# Patient Record
Sex: Female | Born: 1970 | Race: White | Hispanic: No | Marital: Married | Smoking: Never smoker
Health system: Southern US, Community
[De-identification: ages and names within clinical notes are randomized; demographics above are authoritative.]

## PROBLEM LIST (undated history)

## (undated) DIAGNOSIS — I454 Nonspecific intraventricular block: Secondary | ICD-10-CM

## (undated) DIAGNOSIS — I10 Essential (primary) hypertension: Secondary | ICD-10-CM

## (undated) DIAGNOSIS — F32A Depression, unspecified: Secondary | ICD-10-CM

## (undated) DIAGNOSIS — F329 Major depressive disorder, single episode, unspecified: Secondary | ICD-10-CM

## (undated) DIAGNOSIS — Z9289 Personal history of other medical treatment: Secondary | ICD-10-CM

## (undated) DIAGNOSIS — E079 Disorder of thyroid, unspecified: Secondary | ICD-10-CM

## (undated) HISTORY — PX: CERVICAL FUSION: SHX112

## (undated) HISTORY — PX: TUBAL LIGATION: SHX77

## (undated) HISTORY — PX: OTHER SURGICAL HISTORY: SHX169

---

## 1998-08-10 ENCOUNTER — Encounter: Payer: Self-pay | Admitting: Specialist

## 1998-08-10 ENCOUNTER — Ambulatory Visit (HOSPITAL_COMMUNITY): Admission: RE | Admit: 1998-08-10 | Discharge: 1998-08-10 | Payer: Self-pay | Admitting: Specialist

## 2001-02-04 ENCOUNTER — Other Ambulatory Visit: Admission: RE | Admit: 2001-02-04 | Discharge: 2001-02-04 | Payer: Self-pay | Admitting: Obstetrics and Gynecology

## 2006-05-02 ENCOUNTER — Ambulatory Visit (HOSPITAL_COMMUNITY): Admission: RE | Admit: 2006-05-02 | Discharge: 2006-05-02 | Payer: Self-pay | Admitting: Obstetrics and Gynecology

## 2006-05-02 ENCOUNTER — Encounter (INDEPENDENT_AMBULATORY_CARE_PROVIDER_SITE_OTHER): Payer: Self-pay | Admitting: Specialist

## 2008-09-01 ENCOUNTER — Other Ambulatory Visit: Admission: RE | Admit: 2008-09-01 | Discharge: 2008-09-01 | Payer: Self-pay | Admitting: Obstetrics and Gynecology

## 2009-12-30 ENCOUNTER — Ambulatory Visit (HOSPITAL_COMMUNITY): Admission: RE | Admit: 2009-12-30 | Discharge: 2009-12-30 | Payer: Self-pay | Admitting: Obstetrics and Gynecology

## 2010-11-24 NOTE — H&P (Signed)
NAME:  Robin Huffman, Robin Huffman                 ACCOUNT NO.:  [id number]   MEDICAL RECORD NO.:  [id number]          PATIENT TYPE:  AMB   LOCATION:  DAY                           FACILITY:  [facility]   PHYSICIAN:  Tilda Burrow, M.D. DATE OF BIRTH:  1971-06-06   DATE OF ADMISSION:  DATE OF DISCHARGE:  LH                                HISTORY & PHYSICAL   ADMISSION DIAGNOSIS:  Bilateral Bartholin cysts, symptomatic left Bartholin  cyst, desire for elective permanent sterilization.   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  She is currently not considered to be acute infected. She is aware that the  potential for bleeding and slow healing is exists. The procedure has been  reviewed with her. We have chosen against involving the right side due to  the fact that it has been stable without inflammation for quite some time.   Additionally, she desires for permanent sterilization. She understands the  permanency of the requested procedure, has never desired children, and is  aware of the permanency of the requested procedure. One percent failure rate  is quoted to the patient. Falope ring application is reviewed.   PAST MEDICAL HISTORY:  Benign. Has hypothyroidism.   PAST SURGICAL HISTORY:  Negative.   ALLERGIES:  None.   GYNECOLOGICAL HISTORY:  Abnormal Pap 1994 with cryo cautery of the cervix  with subsequent normal Paps for several years.   PHYSICAL EXAMINATION:  Reveals a healthy, slim, Caucasian female. Height 5  foot 6 inches, weight 122. Blood pressure 100/60.  Pupils are equal, round, and reactive. Extraocular movements intact.  NECK:  Supple.  CHEST:  Clear to auscultation.  ABDOMEN:  Nontender.  EXTERNAL GENITALIA:  Nulliparous. Left  Bartholin's cyst 2 to 3 cm. Uterus  anteflexed - normal size, shape and contour. Adnexa nontender.   PLAN:  Laparoscopic tubal sterilization with Falope rings followed by  excision of Bartholin's cyst on left to be performed May 02, 2006.      Tilda Burrow, M.D.  Electronically Signed     JVF/MEDQ  D:  04/30/2006  T:  05/01/2006  Job:  [id number]   cc:   [facility]

## 2010-11-24 NOTE — Op Note (Signed)
NAMETASHEEMA, PERRONE                 ACCOUNT NO.:  [id number]   MEDICAL RECORD NO.:  [id number]          PATIENT TYPE:  AMB   LOCATION:  DAY                           FACILITY:  [facility]   PHYSICIAN:  Tilda Burrow, M.D. DATE OF BIRTH:  [birth year]   DATE OF PROCEDURE:  05/02/2006  DATE OF DISCHARGE:                                 OPERATIVE REPORT   PREOPERATIVE DIAGNOSIS:  Symptomatic left Bartholin's cyst, desire for  elective permanent sterilization.   POSTOPERATIVE DIAGNOSIS:  Symptomatic left Bartholin's cyst, desire for  elective permanent sterilization.   PROCEDURE:  Laparoscopic tubal sterilization, Falope rings, excision of left  Bartholin's cyst.   SURGEON:  Tilda Burrow, M.D.   ASSISTANT:  Victorino Dike - CSC   ANESTHESIA:  General with endotracheal intubation.   COMPLICATIONS:  None.   FINDINGS:  Normal tubes and ovaries bilaterally, smooth small Bartholin's  cyst, left side of introitus.   DETAILS OF THE PROCEDURE:  The patient was taken to the operating room,  prepped and draped for a combined abdominal and vaginal procedure.  An  infraumbilical vertical 1 cm skin incision was made as well as a transverse  suprapubic incision.  Pneumoperitoneum was achieved using a Veress needle,  being careful to oriented the needle toward the pelvis while elevating the  abdominal wall using water droplet technique to confirm intraperitoneal  location then achieving pneumoperitoneum with 2 liters CO2 followed by 5 mm  laparoscopic insertion of the 5 mm infraumbilical trocar.  The abdominal  cavity was visualized and normal anatomy seen.  Attention was then directed  to the pelvis where each fallopian tube could be identified and the uterus  manipulated with a Hulka tenaculum which had been attached to the uterus.  In and out catheterization of the bladder had been performed but the bladder  was only partially emptied.  The left tube was elevated, Falope ring applied  to it, and  then the area beneath the tube infiltrated with Marcaine  solution.  The opposite side was inspected, elevated, and a single Falope  ring placed on the right tube with Marcaine infiltration, both the Falope  ring knuckle of tube and of the area beneath the Falope ring application.  Deflation of the abdomen followed with 200 mL saline instilled in the  abdomen.  The laparoscopic equipment was removed and subcuticular 4-0 Dexon  closure of the skin incisions performed.   BARTHOLIN CYST EXCISION:  The legs were flexed up from the low lithotomy  position into a high lithotomy position whereupon the area just outside the  hymen remnant on the left where the previous Bartholin's abscess drainage  had occurred was identified.  A semi-circular incision was made in the skin  and the flap elevated.  An Allis clamp was used to grasp the firm fibrous  tissue overlying the Bartholin's cyst.  Metzenbaum scissors were used using  a spreading technique to peel around the Bartholin's cyst progressively  dissecting down as the cyst proceeded deeper, laterally, and posteriorly.  Once the cyst was approximately 2/3 exposed, we opened the cyst so that  the  extent of the cyst could be identified then the cyst was peeled very  superficially off the underlying muscular tissues and the scar tissue that  existed at its base.  Some small arteries were encountered which required  point cautery.  The potential space was obliterated with a series of  interrupted 2-0 plain suture.  Dilute Marcaine with epinephrine solution was  placed in the perineum.  The patient had received IV antibiotic prophylaxis  prior to procedure.  Once the tissue edges were reapproximated, the  procedure was considered successfully completed and a vaginal packing placed  just in the introitus and left in place until the recovery area where it was  taken out 20 minutes later after 20 minutes with an ice pack in place.   The patient went home  with instructions to use an ice pack 20 minutes per  hour if needed, pressure to the area if oozing occurred, and Lortab for  pain.  Follow up in two weeks in our office.      Tilda Burrow, M.D.  Electronically Signed     JVF/MEDQ  D:  05/02/2006  T:  05/03/2006  Job:  [id number]

## 2012-07-09 DIAGNOSIS — Z9289 Personal history of other medical treatment: Secondary | ICD-10-CM

## 2012-07-09 HISTORY — DX: Personal history of other medical treatment: Z92.89

## 2013-03-03 ENCOUNTER — Other Ambulatory Visit: Payer: Self-pay | Admitting: Endocrinology

## 2013-03-10 ENCOUNTER — Other Ambulatory Visit: Payer: Self-pay | Admitting: *Deleted

## 2013-03-10 ENCOUNTER — Other Ambulatory Visit: Payer: Self-pay | Admitting: Endocrinology

## 2013-03-10 ENCOUNTER — Telehealth: Payer: Self-pay | Admitting: *Deleted

## 2013-03-10 NOTE — Telephone Encounter (Signed)
Rx refill for Progesterone Micronized 25 mg capsule; take 1 capsule daily in the evening. Did not see on pt's med list. Please advise.

## 2013-03-10 NOTE — Telephone Encounter (Signed)
i can't see where i have even seen this pt.

## 2013-06-23 ENCOUNTER — Emergency Department (HOSPITAL_COMMUNITY)
Admission: EM | Admit: 2013-06-23 | Discharge: 2013-06-23 | Disposition: A | Discharge status: Other Acute Inpatient Hospital | Payer: 59 | Source: Home / Self Care

## 2013-06-23 ENCOUNTER — Encounter (HOSPITAL_COMMUNITY): Payer: Self-pay | Admitting: Emergency Medicine

## 2013-06-23 ENCOUNTER — Emergency Department (HOSPITAL_COMMUNITY): Payer: 59

## 2013-06-23 ENCOUNTER — Emergency Department (HOSPITAL_COMMUNITY)
Admission: EM | Admit: 2013-06-23 | Discharge: 2013-06-23 | Disposition: A | Discharge status: Home / Self Care | Payer: 59 | Attending: Emergency Medicine | Admitting: Emergency Medicine

## 2013-06-23 DIAGNOSIS — E079 Disorder of thyroid, unspecified: Secondary | ICD-10-CM | POA: Insufficient documentation

## 2013-06-23 DIAGNOSIS — R079 Chest pain, unspecified: Secondary | ICD-10-CM | POA: Insufficient documentation

## 2013-06-23 DIAGNOSIS — R110 Nausea: Secondary | ICD-10-CM | POA: Insufficient documentation

## 2013-06-23 DIAGNOSIS — F329 Major depressive disorder, single episode, unspecified: Secondary | ICD-10-CM | POA: Insufficient documentation

## 2013-06-23 DIAGNOSIS — Z79899 Other long term (current) drug therapy: Secondary | ICD-10-CM | POA: Insufficient documentation

## 2013-06-23 DIAGNOSIS — R0789 Other chest pain: Secondary | ICD-10-CM

## 2013-06-23 DIAGNOSIS — F3289 Other specified depressive episodes: Secondary | ICD-10-CM | POA: Insufficient documentation

## 2013-06-23 HISTORY — DX: Disorder of thyroid, unspecified: E07.9

## 2013-06-23 LAB — TROPONIN I: Troponin I: <0.3 ng/mL (ref <0.30)

## 2013-06-23 LAB — CBC
HCT: 40.9 % (ref 36.0–46.0)
Hemoglobin: 13.9 g/dL (ref 12.0–15.0)
MCH: 29.8 pg (ref 26.0–34.0)
RBC: 4.66 MIL/uL (ref 3.87–5.11)
RDW: 15.3 % (ref 11.5–15.5)
WBC: 9.8 10*3/uL (ref 4.0–10.5)

## 2013-06-23 LAB — BASIC METABOLIC PANEL
BUN: 14 mg/dL (ref 6–23)
Calcium: 9.7 mg/dL (ref 8.4–10.5)
Chloride: 100 mEq/L (ref 96–112)
GFR calc non Af Amer: 77 mL/min — ABNORMAL LOW (ref >90)
Glucose, Bld: 87 mg/dL (ref 70–99)
Potassium: 3.9 mEq/L (ref 3.5–5.1)
Sodium: 136 mEq/L (ref 135–145)

## 2013-06-23 LAB — POCT I-STAT TROPONIN I: Troponin i, poc: 0 ng/mL (ref 0.00–0.08)

## 2013-06-23 MED ORDER — ASPIRIN 81 MG PO CHEW
CHEWABLE_TABLET | ORAL | Status: AC
  Filled 2013-06-23: qty 4

## 2013-06-23 MED ORDER — NITROGLYCERIN 0.4 MG SL SUBL
SUBLINGUAL_TABLET | SUBLINGUAL | Status: AC
  Filled 2013-06-23: qty 25

## 2013-06-23 MED ORDER — ASPIRIN 81 MG PO CHEW
324.0000 mg | CHEWABLE_TABLET | Freq: Once | ORAL | Status: AC
  Administered 2013-06-23: 324 mg via ORAL

## 2013-06-23 MED ORDER — NITROGLYCERIN 0.4 MG SL SUBL
0.4000 mg | SUBLINGUAL_TABLET | SUBLINGUAL | Status: DC | PRN
  Administered 2013-06-23: 0.4 mg via SUBLINGUAL

## 2013-06-23 MED ORDER — LORAZEPAM 1 MG PO TABS
1.0000 mg | ORAL_TABLET | Freq: Once | ORAL | Status: AC
  Administered 2013-06-23: 1 mg via ORAL
  Filled 2013-06-23: qty 1

## 2013-06-23 MED ORDER — SODIUM CHLORIDE 0.9 % IV SOLN
Freq: Once | INTRAVENOUS | Status: AC
  Administered 2013-06-23: 12:00:00 via INTRAVENOUS

## 2013-06-23 NOTE — ED Notes (Signed)
Pt reports central chest pressure yesterday starting at 1600 while paying bills. States she inititally thought it was related to stress, but pressure continued into today. This AM pressure started to radiate to back. Denies N/V/diaphoresis. Pt given 1 nitro at [facility] which relieved pain. Denies CP at this time. Pt sinus rhythm on monitor. NAD.

## 2013-06-23 NOTE — ED Notes (Signed)
Discussed at length plan of care with pt and family at bedside. Pt waiting for repeat troponin and consult with cardiology. Pt became tearful during conversation. PA made aware. Cardiology at bedside now.

## 2013-06-23 NOTE — ED Notes (Signed)
IV  NS  TKO  20  ANGIO  R  HAND  1  ATT

## 2013-06-23 NOTE — ED Provider Notes (Signed)
Medical screening examination/treatment/procedure(s) were performed by resident physician or non-physician practitioner and as supervising physician I was immediately available for consultation/collaboration.   Sabina Beavers DOUGLAS MD.   Akshitha Culmer D Dandrea Widdowson, MD 06/23/13 2020 

## 2013-06-23 NOTE — ED Notes (Signed)
Pt  Reports  Symptoms  Of   Chest  Pain         Which  Started  Yesterday      At  4  Pm              she  Is  Awake  And  Alert  And  Oriented  And  Appears  In no  Acute  Distress        She is  Sitting  upright on  The  Exam table  Speaking in  Complete  sentances

## 2013-06-23 NOTE — ED Provider Notes (Signed)
CSN: [id number]     Arrival date & time 06/23/13  1251 History   First MD Initiated Contact with Patient 06/23/13 1335     Chief Complaint  Patient presents with  . Chest Pain   (Consider location/radiation/quality/duration/timing/severity/associated sxs/prior Treatment) HPI Comments: Patient is a 42 year old female with a past medical history of hypertension, thyroid disease, and depression who presents from Urgent Care with chest pain since last night. Patient reports sitting and paying bills last night when she had sudden onset of central chest pain that felt like severe pressure without radiation. Patient reports some associated nausea and feeling "clammy." The pain has been constant since the onset. Patient reports improvement since being in the ED. She believes the pain is brought on by stress. Patient is concerned because her mother had an MI before age 48 and her father has heart disease as well. No aggravating/alleviating factors. No other associated symptoms.    Past Medical History  Diagnosis Date  . Thyroid disease    Past Surgical History  Procedure Laterality Date  . Btl     No family history on file. History  Substance Use Topics  . Smoking status: Never Smoker   . Smokeless tobacco: Not on file  . Alcohol Use: Yes     Comment: OCCASIONAL   OB History   Grav Para Term Preterm Abortions TAB SAB Ect Mult Living                 Review of Systems  Constitutional: Negative for fever, chills and fatigue.  HENT: Negative for trouble swallowing.   Eyes: Negative for visual disturbance.  Respiratory: Negative for shortness of breath.   Cardiovascular: Positive for chest pain. Negative for palpitations.  Gastrointestinal: Positive for nausea. Negative for vomiting, abdominal pain and diarrhea.  Genitourinary: Negative for dysuria and difficulty urinating.  Musculoskeletal: Negative for arthralgias and neck pain.  Skin: Negative for color change.  Neurological: Negative  for dizziness and weakness.  Psychiatric/Behavioral: Negative for dysphoric mood.    Allergies  Review of patient's allergies indicates no known allergies.  Home Medications   Current Outpatient Rx  Name  Route  Sig  Dispense  Refill  . citalopram (CELEXA) 20 MG tablet   Oral   Take 20 mg by mouth 2 (two) times daily.         Marland Kitchen levothyroxine (SYNTHROID, LEVOTHROID) 150 MCG tablet   Oral   Take 150 mcg by mouth daily before breakfast.         . lisinopril (PRINIVIL,ZESTRIL) 5 MG tablet   Oral   Take 5 mg by mouth daily.         . Misc Natural Products (PROGESTERONE EX)   Apply externally   Apply 1 application topically every evening.         . phentermine 37.5 MG capsule   Oral   Take 37.5 mg by mouth daily.         Marland Kitchen PRESCRIPTION MEDICATION   Apply externally   Apply 1 application topically daily.         . Progesterone Micronized (PROGESTERONE PO)   Oral   Take 1 tablet by mouth every evening.           BP 115/80  Pulse 71  Temp(Src) 97.6 F (36.4 C) (Oral)  Resp 14  SpO2 100%  LMP 06/09/2013 Physical Exam  Nursing note and vitals reviewed. Constitutional: She is oriented to person, place, and time. She appears well-developed and well-nourished.  No distress.  HENT:  Head: Normocephalic and atraumatic.  Eyes: Conjunctivae and EOM are normal. Pupils are equal, round, and reactive to light.  Neck: Normal range of motion.  Cardiovascular: Normal rate and regular rhythm.  Exam reveals no gallop and no friction rub.   No murmur heard. Pulmonary/Chest: Effort normal and breath sounds normal. She has no wheezes. She has no rales. She exhibits no tenderness.  Abdominal: Soft. She exhibits no distension. There is no tenderness. There is no rebound and no guarding.  Musculoskeletal: Normal range of motion.  Neurological: She is alert and oriented to person, place, and time. Coordination normal.  Speech is goal-oriented. Moves limbs without ataxia.    Skin: Skin is warm and dry.  Psychiatric: She has a normal mood and affect. Her behavior is normal.    ED Course  Procedures (including critical care time) Labs Review Labs Reviewed  BASIC METABOLIC PANEL - Abnormal; Notable for the following:    GFR calc non Af Amer 77 (*)    GFR calc Af Amer 89 (*)    All other components within normal limits  CBC  TROPONIN I  POCT I-STAT TROPONIN I   Imaging Review Dg Chest 2 View  06/23/2013   CLINICAL DATA:  Left-sided chest pain.  EXAM: CHEST  2 VIEW  COMPARISON:  None.  FINDINGS: The cardiomediastinal silhouette is within normal limits. The lungs are well inflated and clear. There is no evidence of pleural effusion or pneumothorax. No acute osseous abnormality is identified.  IMPRESSION: Negative.   Electronically Signed   By: Sebastian Ache   On: 06/23/2013 14:47    EKG Interpretation    Date/Time:  Tuesday June 23 2013 13:02:07 EST Ventricular Rate:  73 PR Interval:  135 QRS Duration: 87 QT Interval:  378 QTC Calculation: 416 R Axis:   71 Text Interpretation:  Sinus rhythm RSR' in V1 or V2, right VCD or RVH Since last tracing T wave inversion now evident in Septal leads HEART RATE DECREASED SINCE 06/23/2013 Confirmed by KNAPP  MD-I, IVA ([id number]) on 06/23/2013 1:08:41 PM            MDM   1. Chest discomfort   2. Chest pain     1:37 PM Labs pending. First troponin negative. EKG shows T-wave inversion in septal leads. Vitals stable and patient afebrile.   2:45 PM Patient will have chest xray. I will consult Cardiology for further evaluation and further plan. Vitals stable and patient afebrile. Patient reports improvement of her pain.   3:55 PM Cardiology will see the patient. Patient signed out to Junius Finner, PA-C.   Emilia Beck, PA-C 06/24/13 1125

## 2013-06-23 NOTE — ED Provider Notes (Signed)
PT reports central chest pressure that started last night while looking at bills. It continued until she got NTG in the ED. PT has family history of CAD at a young age. Her EKG has new flipped T waves septally.   Pt is alert, cooperative and appears comfortable at this time.   Medical screening examination/treatment/procedure(s) were conducted as a shared visit with non-physician practitioner(s) and myself.  I personally evaluated the patient during the encounter.  EKG Interpretation    Date/Time:  Tuesday June 23 2013 13:02:07 EST Ventricular Rate:  73 PR Interval:  135 QRS Duration: 87 QT Interval:  378 QTC Calculation: 416 R Axis:   71 Text Interpretation:  Sinus rhythm RSR' in V1 or V2, right VCD or RVH Since last tracing T wave inversion now evident in Septal leads HEART RATE DECREASED SINCE 06/23/2013 Confirmed by Damian Hofstra  MD-I, Marchetta Navratil ([id number]) on 06/23/2013 1:08:41 PM             Devoria Albe, MD, Franz Dell, MD 06/23/13 1556

## 2013-06-23 NOTE — H&P (Addendum)
Note: The patient was seen in consultation in the emergency room. The patient was not admitted to the hospital.  Cardiology consult Note.    Date: 06/23/2013               Patient Name:  Robin Huffman MRN: [id number]  DOB: [birth year] Age / Sex: 42 y.o., female        PCP: [facility] Primary Cardiologist: New/ Robin Huffman         History of Present Illness: Patient is a 42 y.o. female with a PMHx of depression, hypertension and thyroid disease., who was admitted to [facility] on 06/23/2013 for evaluation of her chest discomfort.  Robin Huffman has no history of cardiac problems in the past. She developed chest pain yesterday while paying bills. The pain continued overnight. She had some continued pain this morning she presented to the urgent care. Her pain was relieved with sublingual nitroglycerin glycerin and she was transferred to [facility]..   " like someone sitting on her chest, relieved with NTG. No pleuretic, no ripping or tearing sensation No diaphoresis, no dyspnea, no syncope or pre-syncope  + strong family hx of CAD - mother with MI in her 30s.  Cholesterol levels were normal at last check.  She does not exercise regularly.  She is able to do all of her normal activities without any significant chest pain, shortness breath, syncope or presyncope.   Medications: Outpatient medications: Current Facility-Administered Medications  Medication Dose Route Frequency Provider Last Rate Last Dose  . LORazepam (ATIVAN) tablet 1 mg  1 mg Oral Once Junius Finner, PA-C       Current Outpatient Prescriptions  Medication Sig Dispense Refill  . citalopram (CELEXA) 20 MG tablet Take 20 mg by mouth 2 (two) times daily.      Marland Kitchen levothyroxine (SYNTHROID, LEVOTHROID) 150 MCG tablet Take 150 mcg by mouth daily before breakfast.      . lisinopril (PRINIVIL,ZESTRIL) 5 MG tablet Take 5 mg by mouth daily.      . Misc Natural Products (PROGESTERONE EX) Apply 1 application topically every  evening.      . phentermine 37.5 MG capsule Take 37.5 mg by mouth daily.      Marland Kitchen PRESCRIPTION MEDICATION Apply 1 application topically daily.      . Progesterone Micronized (PROGESTERONE PO) Take 1 tablet by mouth every evening.          No Known Allergies   Past Medical History  Diagnosis Date  . Thyroid disease     Past Surgical History  Procedure Laterality Date  . Btl     Mother had an MI in her 30s.  ( she smoked)  No family history on file.  Social History:  reports that she has never smoked. She does not have any smokeless tobacco history on file. She reports that she drinks alcohol. Her drug history is not on file.   Review of Systems: Constitutional:  admits to  Poor  appetite change   HEENT: denies photophobia, eye pain, redness, hearing loss, ear pain, congestion, sore throat, rhinorrhea, sneezing, neck pain, neck stiffness and tinnitus.  Respiratory: admits to  + cough, chest tightness, , denies  wheezing.  Cardiovascular: admits to chest pain,    Gastrointestinal: denies nausea, vomiting, abdominal pain, diarrhea, constipation, blood in stool.  Genitourinary: denies dysuria, urgency, frequency, hematuria, flank pain and difficulty urinating.  Musculoskeletal: admits to  back pain,    Skin: denies pallor, rash and wound.  Neurological: denies dizziness,  seizures, syncope, weakness, light-headedness, numbness and headaches.   Hematological: denies adenopathy, easy bruising, personal or family bleeding history.  Psychiatric/ Behavioral: admits to   nervousness,  , she does not sleep well.    Physical Exam: BP 124/82  Pulse 73  Temp(Src) 97.6 F (36.4 C) (Oral)  Resp 16  SpO2 100%  LMP 06/09/2013  General: Vital signs reviewed and noted. Well-developed, well-nourished, in no acute distress; alert, appropriate and cooperative throughout examination.  Head: Normocephalic, atraumatic, sclera anicteric, mucus membranes are moist  Neck: Supple. Negative for  carotid bruits. JVD not elevated.  Lungs:  Clear bilaterally to auscultation without wheezes, rales, or rhonchi. Breathing is normal   Heart: RR with split S1 S2., no murmurs  Abdomen:  Soft, non-tender, non-distended with normoactive bowel sounds. No hepatomegaly. No rebound/guarding. No obvious abdominal masses  MSK: Strength and the appear normal for age.  Extremities: No clubbing or cyanosis. No edema.  Distal pedal pulses are 2+ and equal bilaterally.  Neurologic: Alert and oriented X 3. Moves all extremities spontaneously  Psych:  Responds to questions appropriately with a normal affect.    Lab results: Basic Metabolic Panel:  Recent Labs Lab 06/23/13 1310  NA 136  K 3.9  CL 100  CO2 25  GLUCOSE 87  BUN 14  CREATININE 0.90  CALCIUM 9.7    Liver Function Tests: No results found for this basename: AST, ALT, ALKPHOS, BILITOT, PROT, ALBUMIN,  in the last 168 hours No results found for this basename: LIPASE, AMYLASE,  in the last 168 hours  CBC:  Recent Labs Lab 06/23/13 1310  WBC 9.8  HGB 13.9  HCT 40.9  MCV 87.8  PLT 294    Cardiac Enzymes: No results found for this basename: CKTOTAL, CKMB, CKMBINDEX, TROPONINI,  in the last 168 hours  BNP: No components found with this basename: POCBNP,   CBG: No results found for this basename: GLUCAP,  in the last 168 hours  Coagulation Studies: No results found for this basename: LABPROT, INR,  in the last 72 hours   Other results:  EKG : NSR , inc. RBBB with associated T wave changes.     Imaging: Dg Chest 2 View  06/23/2013   CLINICAL DATA:  Left-sided chest pain.  EXAM: CHEST  2 VIEW  COMPARISON:  None.  FINDINGS: The cardiomediastinal silhouette is within normal limits. The lungs are well inflated and clear. There is no evidence of pleural effusion or pneumothorax. No acute osseous abnormality is identified.  IMPRESSION: Negative.   Electronically Signed   By: Sebastian Ache   On: 06/23/2013 14:47        Assessment & Plan:  1. Atypical chest pain: Patient presents with approximately 12 hours of chest pain. The pain was initially described as heaviness and like someone was sitting on top of her. It was relieved with nitroglycerin.  The pain started when she was at the [facility] discussing Taxes" on her new vehicle.   She suddenly developed this chest discomfort. The pain lasted all along that she did not sleep well,. When the pain had persisted all night long she presented to the emergency department.  She admits to eating a fairly poorly.. She has symptoms that are consistent with gastroesophageal reflux disease. She states that she coughs frequently at night when she lies down. This also may be due to esophageal reflux. I've recommended that she start taking omeprazole 20 mg a day. I also suggested that she improve her diet which will  help with her gastroesophageal reflux.  Despite this prolonged chest pain, her cardiac enzymes are negative. EKG is nonacute. We'll recheck one additional troponin level. If this troponin is negative then she should be able to safely go home.  Her heart score is 1 for moderately suspicious chest pain.    We'll schedule her for a LEXIscan Myoview study next Monday . I'll see her in the office for followup visit following that.      Vesta Mixer, Montez Hageman., MD, [facility] 06/23/2013, 4:07 PM

## 2013-06-23 NOTE — ED Provider Notes (Signed)
Robin Huffman is a 42 y.o. female who presents to Urgent Care today for chest pain and pressure that started yesterday at 4pm. Sensation is a crushing 7/10 pressure substernally brought on initially yesterday by stress. It has not changed since that time except that when she woke up this morning it radiated slightly into her back. She has never had similar symptoms. Reports some nausea and dyspnea but denies vomiting, sweating, dizziness, fainting, fevers, or chills. She denies history of heart disease. She has had heartburn lately but this does not feel like that burning pain. Pain is not relieved with rest.  History is significant for thyroid disease, depression for which she takes celexa, HTN (takes lisinopril daily) and recent weight loss of 25 lbs in 4 months with dieting and taking adipex for the last 6 months.  Family history is significant for HLD and stroke in age 18s in her mother, father with h/o palpitations, mother with h/o ventricular tachycardia when in the operating room.   Past Medical History  Diagnosis Date  . Thyroid disease   Celexa for depression Lisinopril 1 year for HTN, takes daily No heart disease or diabetes Excedrin migraine occasionally   History  Substance Use Topics  . Smoking status: Never Smoker   . Smokeless tobacco: Not on file  . Alcohol Use: Yes     Comment: OCCASIONAL  Never smoker No drug use Occasional EtOH  ROS as above Medications reviewed. No current facility-administered medications for this encounter.   Current Outpatient Prescriptions  Medication Sig Dispense Refill  . Citalopram Hydrobromide (CELEXA PO) Take by mouth.      . Levothyroxine Sodium (SYNTHROID PO) Take by mouth.      Marland Kitchen LISINOPRIL PO Take by mouth.      . Phentermine HCl (ADIPEX-P PO) Take by mouth.      . Progesterone Micronized (PROGESTERONE PO) Take by mouth.        Exam:  BP 118/73  Pulse 104  Temp(Src) 97.6 F (36.4 C) (Oral)  Resp 20  SpO2 100%  LMP  06/09/2013 Gen: Well NAD, pleasant, non-diaphoretic HEENT: EOMI,  MMM Lungs: Normal work of breathing. CTAB. Heart: Tachycardic to 100s, regular rate and rhythm no m/r/g Abd: NABS, Soft. NT, ND Exts: Non edematous BL  LE, warm and well perfused.  Skin: No rash or cyanosis  No results found for this or any previous visit (from the past 24 hour(s)). No results found.  Assessment and Plan: 42 y.o. female with h/o HTN , depression, hypothyroidism, and taking adipex weight loss medication here with chest pressure at rest and mild dyspea and nausea. Likely heartburn vs anxiety, but needs cardiac workup with EKG findings here of occasional PVCs and h/o HTN, substernal chest pain brought on by emotion and unrelieved by rest. Pt currently stable with good oxygenation. - Aspirin and nitroglycerin here. - Will transfer to ED by EMS for further workup.      Leona Singleton, MD 06/23/13 [phone]

## 2013-06-23 NOTE — ED Notes (Signed)
Pt  Reports  She  Became  Nauseated  And  Skin   Became  Clammy       Bp  107  /  73   98       Dr  kindl  Notified          No change  In pain  Scale

## 2013-06-23 NOTE — ED Notes (Signed)
Per [facility]: Pt from [facility] with co CP radiates to back since yesterday. Given 325 aspirin, 1 nitro. Denies CP at this time. Hx: Anxiety, thyroid disease. 94 SR; EKG unremarkable. 120/74. 96 2L [place].

## 2013-06-23 NOTE — ED Provider Notes (Signed)
4:03 PM  Pt signed out to me by Kennon Portela, PA-C at shift change.  Cardiology will be seeing pt to determine pt dispo. Second troponin also pending.  4:41 PM Dr. Melburn Popper, cardiology examined and spoke with pt, she may be discharged home if 2nd troponin is negative.  Will have pt f/u outpatient for stress test.  2nd troponin: negative.   Pt will be discharged home.  Pt is to f/u with cardiology, Dr. Melburn Popper, for stress test.  Return precautions provided. Pt verbalized understanding and agreement with tx plan.   Junius Finner, PA-C 06/24/13 [phone]

## 2013-06-24 ENCOUNTER — Telehealth: Payer: Self-pay | Admitting: *Deleted

## 2013-06-24 DIAGNOSIS — R079 Chest pain, unspecified: Secondary | ICD-10-CM

## 2013-06-24 NOTE — ED Provider Notes (Signed)
See prior note   Ward Givens, MD 06/24/13 1620

## 2013-06-24 NOTE — Telephone Encounter (Signed)
Left msg to expect our office to call to schedule further testing, call with questions. Number provided.

## 2013-06-27 NOTE — ED Provider Notes (Signed)
Medical screening examination/treatment/procedure(s) were performed by non-physician practitioner and as supervising physician I was immediately available for consultation/collaboration.  EKG Interpretation    Date/Time:  Tuesday June 23 2013 13:02:07 EST Ventricular Rate:  73 PR Interval:  135 QRS Duration: 87 QT Interval:  378 QTC Calculation: 416 R Axis:   71 Text Interpretation:  Sinus rhythm RSR' in V1 or V2, right VCD or RVH Since last tracing T wave inversion now evident in Septal leads HEART RATE DECREASED SINCE 06/23/2013 Confirmed by KNAPP  MD-I, IVA ([id number]) on 06/23/2013 1:08:41 PM              Roney Marion, MD 06/27/13 [phone]

## 2013-06-29 ENCOUNTER — Encounter: Payer: Self-pay | Admitting: Cardiology

## 2013-06-29 ENCOUNTER — Ambulatory Visit (HOSPITAL_COMMUNITY): Payer: 59 | Attending: Cardiology | Admitting: Radiology

## 2013-06-29 VITALS — BP 128/84 | HR 69 | Ht 67.0 in | Wt 150.0 lb

## 2013-06-29 DIAGNOSIS — I10 Essential (primary) hypertension: Secondary | ICD-10-CM | POA: Insufficient documentation

## 2013-06-29 DIAGNOSIS — Z8249 Family history of ischemic heart disease and other diseases of the circulatory system: Secondary | ICD-10-CM | POA: Insufficient documentation

## 2013-06-29 DIAGNOSIS — R5381 Other malaise: Secondary | ICD-10-CM | POA: Insufficient documentation

## 2013-06-29 DIAGNOSIS — R079 Chest pain, unspecified: Secondary | ICD-10-CM | POA: Insufficient documentation

## 2013-06-29 DIAGNOSIS — R0602 Shortness of breath: Secondary | ICD-10-CM

## 2013-06-29 DIAGNOSIS — I4510 Unspecified right bundle-branch block: Secondary | ICD-10-CM | POA: Insufficient documentation

## 2013-06-29 DIAGNOSIS — R0989 Other specified symptoms and signs involving the circulatory and respiratory systems: Secondary | ICD-10-CM | POA: Insufficient documentation

## 2013-06-29 DIAGNOSIS — R0609 Other forms of dyspnea: Secondary | ICD-10-CM | POA: Insufficient documentation

## 2013-06-29 MED ORDER — REGADENOSON 0.4 MG/5ML IV SOLN
0.4000 mg | Freq: Once | INTRAVENOUS | Status: AC
  Administered 2013-06-29: 0.4 mg via INTRAVENOUS

## 2013-06-29 MED ORDER — TECHNETIUM TC 99M SESTAMIBI GENERIC - CARDIOLITE
11.0000 | Freq: Once | INTRAVENOUS | Status: AC | PRN
  Administered 2013-06-29: 11 via INTRAVENOUS

## 2013-06-29 MED ORDER — TECHNETIUM TC 99M SESTAMIBI GENERIC - CARDIOLITE
33.0000 | Freq: Once | INTRAVENOUS | Status: AC | PRN
  Administered 2013-06-29: 33 via INTRAVENOUS

## 2013-06-29 MED ORDER — AMINOPHYLLINE 25 MG/ML IV SOLN
75.0000 mg | Freq: Once | INTRAVENOUS | Status: AC
  Administered 2013-06-29: 75 mg via INTRAVENOUS

## 2013-06-29 NOTE — Progress Notes (Signed)
[facility] SITE 3 NUCLEAR MED [address] [place], [place] [address] [phone]    Cardiology Nuclear Med Study  Robin Huffman is a 42 y.o. female     MRN : [id number]     DOB: [birth year]  Procedure Date: 06/29/2013  Nuclear Med Background Indication for Stress Test:  Evaluation for Ischemia and Post Hospital History:  No CAD HX Cardiac Risk Factors: Family History - CAD, Hypertension and RBBB  Symptoms:  Chest Pain, DOE, Fatigue and SOB   Nuclear Pre-Procedure Caffeine/Decaff Intake:  None NPO After: 5:00pm   Lungs:  clear O2 Sat: 99% on room air. IV 0.9% NS with Angio Cath:  20g  IV Site: R Antecubital  IV Started by:  Milana Na, EMT-P  Chest Size (in):  38 Cup Size: D  Height: 5\' 7"  (1.702 m)  Weight:  150 lb (68.04 kg)  BMI:  Body mass index is 23.49 kg/(m^2). Tech Comments:  n/a    Nuclear Med Study 1 or 2 day study: 1 day  Stress Test Type:  Lexiscan  Reading MD: Cassell Clement, MD  Order Authorizing Provider:  Kristeen Miss, MD  Resting Radionuclide: Technetium 12m Sestamibi  Resting Radionuclide Dose: 11.0 mCi   Stress Radionuclide:  Technetium 35m Sestamibi  Stress Radionuclide Dose: 33.0 mCi           Stress Protocol Rest HR: 69 Stress HR: 123  Rest BP: 128/84 Stress BP: 130/70  Exercise Time (min): n/a METS: n/a           Dose of Adenosine (mg):  n/a Dose of Lexiscan: 0.4 mg  Dose of Atropine (mg): n/a Dose of Dobutamine: n/a mcg/kg/min (at max HR)  Stress Test Technologist: Nelson Chimes, BS-ES  Nuclear Technologist:  Harlow Asa, CNMT     Rest Procedure:  Myocardial perfusion imaging was performed at rest 45 minutes following the intravenous administration of Technetium 31m Sestamibi. Rest ECG: NSR - Normal EKG  Stress Procedure:  The patient received IV Lexiscan 0.4 mg over 15-seconds.  Technetium 75m Sestamibi injected at 30-seconds.  Quantitative spect images were obtained after a 45 minute delay.  During the  infusion of Lexiscan, the patient became flushed and nauseated.  75 mg Aminophylline given by Edwyna Perfect, RN. Patient began to feel much better.  Stress ECG: No significant change from baseline ECG  QPS Raw Data Images:  Normal; no motion artifact; normal heart/lung ratio. Stress Images:  Normal homogeneous uptake in all areas of the myocardium. Rest Images:  Normal homogeneous uptake in all areas of the myocardium. Subtraction (SDS):  No evidence of ischemia. Transient Ischemic Dilatation (Normal <1.22):  1.02 Lung/Heart Ratio (Normal <0.45):  0.23  Quantitative Gated Spect Images QGS EDV:  72 ml QGS ESV:  17 ml  Impression Exercise Capacity:  Lexiscan with no exercise. BP Response:  Normal blood pressure response. Clinical Symptoms:  No chest pain. ECG Impression:  No significant ST segment change suggestive of ischemia. Comparison with Prior Nuclear Study: No images to compare  Overall Impression:  Normal stress nuclear study.  LV Ejection Fraction: 76%.  LV Wall Motion:  NL LV Function; NL Wall Motion  [employer]

## 2014-02-09 ENCOUNTER — Other Ambulatory Visit (HOSPITAL_COMMUNITY): Payer: Self-pay | Admitting: Family Medicine

## 2014-02-09 DIAGNOSIS — R1011 Right upper quadrant pain: Secondary | ICD-10-CM

## 2014-02-15 ENCOUNTER — Ambulatory Visit (HOSPITAL_COMMUNITY)
Admission: RE | Admit: 2014-02-15 | Discharge: 2014-02-15 | Disposition: A | Discharge status: Home / Self Care | Payer: 59 | Source: Ambulatory Visit | Attending: Family Medicine | Admitting: Family Medicine

## 2014-02-15 DIAGNOSIS — R1011 Right upper quadrant pain: Secondary | ICD-10-CM

## 2014-02-15 DIAGNOSIS — K7689 Other specified diseases of liver: Secondary | ICD-10-CM | POA: Insufficient documentation

## 2014-02-18 ENCOUNTER — Other Ambulatory Visit (HOSPITAL_COMMUNITY): Payer: Self-pay | Admitting: Family Medicine

## 2014-02-18 DIAGNOSIS — R1011 Right upper quadrant pain: Secondary | ICD-10-CM

## 2014-02-23 ENCOUNTER — Emergency Department (HOSPITAL_COMMUNITY)
Admission: EM | Admit: 2014-02-23 | Discharge: 2014-02-23 | Disposition: A | Discharge status: Home / Self Care | Payer: 59 | Source: Home / Self Care

## 2014-02-23 ENCOUNTER — Encounter (HOSPITAL_COMMUNITY): Payer: Self-pay | Admitting: Emergency Medicine

## 2014-02-23 DIAGNOSIS — M25519 Pain in unspecified shoulder: Secondary | ICD-10-CM | POA: Diagnosis not present

## 2014-02-23 DIAGNOSIS — M25511 Pain in right shoulder: Secondary | ICD-10-CM

## 2014-02-23 DIAGNOSIS — M436 Torticollis: Secondary | ICD-10-CM

## 2014-02-23 MED ORDER — TIZANIDINE HCL 4 MG PO TABS: 4.0000 mg | ORAL_TABLET | Freq: Every day | ORAL | Status: DC

## 2014-02-23 MED ORDER — HYDROCODONE-ACETAMINOPHEN 5-325 MG PO TABS: 1.0000 | ORAL_TABLET | ORAL | Status: DC | PRN

## 2014-02-23 MED ORDER — TRIAMCINOLONE ACETONIDE 40 MG/ML IJ SUSP
INTRAMUSCULAR | Status: AC
  Filled 2014-02-23: qty 1

## 2014-02-23 NOTE — ED Notes (Signed)
C/o right shoulder pain onset 4 days Pain travels from right side of neck down to right arm Increases w/activity; tingly of fingers Denies inj/trauma Alert; no signs of acute distress.

## 2014-02-23 NOTE — ED Provider Notes (Signed)
CSN: [id number]     Arrival date & time 02/23/14  1610 History   None    Chief Complaint  Patient presents with  . Shoulder Pain   (Consider location/radiation/quality/duration/timing/severity/associated sxs/prior Treatment)  HPI  Patient is a 43 year old female presenting today with complaints of right shoulder on pain which started this past Friday morning. Patient states she "woke up" with right neck, shoulder, upper arm pain. Patient states the discomfort radiates from her neck to approximately her elbow.  She denies numbness but reports some tingling towards her fingertips.  Patient denies any known injury, or history of neck or shoulder complaints.    Past Medical History  Diagnosis Date  . Thyroid disease    Past Surgical History  Procedure Laterality Date  . Btl     No family history on file. History  Substance Use Topics  . Smoking status: Never Smoker   . Smokeless tobacco: Not on file  . Alcohol Use: Yes     Comment: OCCASIONAL   OB History   Grav Para Term Preterm Abortions TAB SAB Ect Mult Living                 Review of Systems  Constitutional: Negative.  Negative for fever, chills and fatigue.  HENT: Negative.   Eyes: Negative.   Respiratory: Negative.   Cardiovascular: Negative.   Gastrointestinal: Negative.   Endocrine:       History of hypothyroidism for which she takes Synthroid.  Genitourinary: Negative.   Musculoskeletal: Negative for neck pain and neck stiffness.  Skin: Negative.   Allergic/Immunologic: Negative.   Neurological: Negative for weakness and numbness.  Hematological: Negative.   Psychiatric/Behavioral: Negative.     Allergies  Review of patient's allergies indicates no known allergies.  Home Medications   Prior to Admission medications   Medication Sig Start Date End Date Taking? Authorizing Provider  levothyroxine (SYNTHROID, LEVOTHROID) 150 MCG tablet Take 150 mcg by mouth daily before breakfast.   Yes Historical  Provider, MD  Progesterone Micronized (PROGESTERONE PO) Take 1 tablet by mouth every evening.    Yes Historical Provider, MD  citalopram (CELEXA) 20 MG tablet Take 20 mg by mouth 2 (two) times daily.    Historical Provider, MD  HYDROcodone-acetaminophen (NORCO/VICODIN) 5-325 MG per tablet Take 1-2 tablets by mouth every 4 (four) hours as needed. 02/23/14   Servando Salina, NP  lisinopril (PRINIVIL,ZESTRIL) 5 MG tablet Take 5 mg by mouth daily.    Historical Provider, MD  Misc Natural Products (PROGESTERONE EX) Apply 1 application topically every evening.    Historical Provider, MD  phentermine 37.5 MG capsule Take 37.5 mg by mouth daily.    Historical Provider, MD  PRESCRIPTION MEDICATION Apply 1 application topically daily.    Historical Provider, MD  tiZANidine (ZANAFLEX) 4 MG tablet Take 1 tablet (4 mg total) by mouth at bedtime. May break tablets in half as needed. 02/23/14   Servando Salina, NP   BP 122/67  Pulse 79  Temp(Src) 98.5 F (36.9 C) (Oral)  Resp 16  SpO2 98%  Physical Exam  Nursing note and vitals reviewed. Constitutional: She appears well-developed and well-nourished. No distress.  HENT:  Head: Normocephalic and atraumatic.  Neck: Normal range of motion. Neck supple.  Negative for nuchal rigidity.  Cardiovascular: Normal rate, regular rhythm, normal heart sounds and intact distal pulses.  Exam reveals no gallop and no friction rub.   No murmur heard. Pulmonary/Chest: Effort normal and breath sounds normal. No respiratory distress.  She has no wheezes. She has no rales. She exhibits no tenderness.  Musculoskeletal:       Right shoulder: She exhibits decreased range of motion and pain. She exhibits no bony tenderness, no swelling, no crepitus, no deformity and normal strength.       Arms: Patient has full and active range of motion to right elbow and right wrist. Patient has limited range of motion to R shoulder secondary to discomfort.  Color movement and sensation to  distal extremity intact. Cap refill less than 3 seconds.  Neurological: No cranial nerve deficit. She exhibits normal muscle tone.  Cranial nerve II through XII grossly intact.   Skin: Skin is warm and dry. She is not diaphoretic.    ED Course  Trigger point Injection of Trapezius Muscle Date/Time: 02/23/2014 6:00 PM Performed by: Servando Salina Authorized by: Servando Salina Consent: Verbal consent obtained. Risks and benefits: risks, benefits and alternatives were discussed Consent given by: patient Patient identity confirmed: verbally with patient and arm band Preparation: Patient was prepped and draped in the usual sterile fashion. Local anesthesia used: no Patient sedated: no Patient tolerance: Patient tolerated the procedure well with no immediate complications. Comments: Used 40 mg Kenalog with 1 cc of 1% lidocaine for a total of 1.5 cc administered in 3 injection sites of right trapezius muscle.  Patient reports good initial relief of shoulder pain within 15 minutes of trigger point injections.   (including critical care time) Labs Review Labs Reviewed - No data to display  Imaging Review No results found.   MDM   1. Acute torticollis   2. Trigger point of shoulder region, right    Meds ordered this encounter  Medications  . HYDROcodone-acetaminophen (NORCO/VICODIN) 5-325 MG per tablet    Sig: Take 1-2 tablets by mouth every 4 (four) hours as needed.    Dispense:  15 tablet    Refill:  0  . tiZANidine (ZANAFLEX) 4 MG tablet    Sig: Take 1 tablet (4 mg total) by mouth at bedtime. May break tablets in half as needed.    Dispense:  30 tablet    Refill:  0   40 mg of Kenalog with 1cc of 1% lidocaine.  Discussed concerns regarding cervical nerve irritation secondary to swelling muscle spasm. Patient advised to followup with cervical spine specialist of her choice if failure to improve or resolve within the next 5 or 7 days.  The patient is a Engineer, civil (consulting) and  verbalizes understanding and agrees to plan of care.       Servando Salina, NP 02/23/14 [phone]

## 2014-02-23 NOTE — Discharge Instructions (Signed)
Trigger Point Injection Trigger points are areas where you have muscle pain. A trigger point injection is a shot given in the trigger point to relieve that pain. A trigger point might feel like a knot in your muscle. It hurts to press on a trigger point. Sometimes the pain spreads out (radiates) to other parts of the body. For example, pressing on a trigger point in your shoulder might cause pain in your arm or neck. You might have one trigger point. Or, you might have more than one. People often have trigger points in their upper back and lower back. They also occur often in the neck and shoulders. Pain from a trigger point lasts for a long time. It can make it hard to keep moving. You might not be able to do the exercise or physical therapy that could help you deal with the pain. A trigger point injection may help. It does not work for everyone. But, it may relieve your pain for a few days or a few months. A trigger point injection does not cure long-lasting (chronic) pain. LET YOUR CAREGIVER KNOW ABOUT:  Any allergies (especially to latex, lidocaine, or steroids).  Blood-thinning medicines that you take. These drugs can lead to bleeding or bruising after an injection. They include:  Aspirin.  Ibuprofen.  Clopidogrel.  Warfarin.  Other medicines you take. This includes all vitamins, herbs, eyedrops, over-the-counter medicines, and creams.  Use of steroids.  Recent infections.  Past problems with numbing medicines.  Bleeding problems.  Surgeries you have had.  Other health problems. RISKS AND COMPLICATIONS A trigger point injection is a safe treatment. However, problems may develop, such as:  Minor side effects usually go away in 1 to 2 days. These may include:  Soreness.  Bruising.  Stiffness.  More serious problems are rare. But, they may include:  Bleeding under the skin (hematoma).  Skin infection.  Breaking off of the needle under your skin.  Lung  puncture.  The trigger point injection may not work for you. BEFORE THE PROCEDURE You may need to stop taking any medicine that thins your blood. This is to prevent bleeding and bruising. Usually these medicines are stopped several days before the injection. No other preparation is needed. PROCEDURE  A trigger point injection can be given in your caregiver's office or in a clinic. Each injection takes 2 minutes or less.  Your caregiver will feel for trigger points. The caregiver may use a marker to circle the area for the injection.  The skin over the trigger point will be washed with a germ-killing (antiseptic) solution.  The caregiver pinches the spot for the injection.  Then, a very thin needle is used for the shot. You may feel pain or a twitching feeling when the needle enters the trigger point.  A numbing solution may be injected into the trigger point. Sometimes a drug to keep down swelling, redness, and warmth (inflammation) is also injected.  Your caregiver moves the needle around the trigger zone until the tightness and twitching goes away.  After the injection, your caregiver may put gentle pressure over the injection site.  Then it is covered with a bandage. AFTER THE PROCEDURE  You can go right home after the injection.  The bandage can be taken off after a few hours.  You may feel sore and stiff for 1 to 2 days.  Go back to your regular activities slowly. Your caregiver may ask you to stretch your muscles. Do not do anything that takes   extra energy for a few days.  Follow your caregiver's instructions to manage and treat other pain. Document Released: 06/14/2011 Document Revised: 10/20/2012 Document Reviewed: 06/14/2011 Exit[employer] Patient Information 2015 [employer]. This information is not intended to replace advice given to you by your health care provider. Make sure you discuss any questions you have with your health care provider.  

## 2014-02-23 NOTE — ED Provider Notes (Signed)
Medical screening examination/treatment/procedure(s) were performed by a resident physician or non-physician practitioner and as the supervising physician I was immediately available for consultation/collaboration.  Shelly Flatten, MD Family Medicine   Ozella Rocks, MD 02/23/14 [phone]

## 2014-03-08 ENCOUNTER — Encounter (HOSPITAL_COMMUNITY)
Admission: RE | Admit: 2014-03-08 | Discharge: 2014-03-08 | Disposition: A | Discharge status: Home / Self Care | Payer: 59 | Source: Ambulatory Visit | Attending: Family Medicine | Admitting: Family Medicine

## 2014-03-08 ENCOUNTER — Encounter (HOSPITAL_COMMUNITY): Payer: Self-pay

## 2014-03-08 DIAGNOSIS — R1011 Right upper quadrant pain: Secondary | ICD-10-CM | POA: Diagnosis present

## 2014-03-08 MED ORDER — TECHNETIUM TC 99M MEBROFENIN IV KIT
5.0000 | PACK | Freq: Once | INTRAVENOUS | Status: AC | PRN
  Administered 2014-03-08: 5 via INTRAVENOUS

## 2014-03-08 MED ORDER — SINCALIDE 5 MCG IJ SOLR
INTRAMUSCULAR | Status: AC
  Administered 2014-03-08: 1.45 ug via INTRAVENOUS
  Filled 2014-03-08: qty 5

## 2014-03-08 MED ORDER — STERILE WATER FOR INJECTION IJ SOLN
INTRAMUSCULAR | Status: AC
  Administered 2014-03-08: 5 mL via INTRAVENOUS
  Filled 2014-03-08: qty 10

## 2014-03-29 ENCOUNTER — Other Ambulatory Visit (HOSPITAL_COMMUNITY): Payer: Self-pay | Admitting: Neurosurgery

## 2014-03-29 DIAGNOSIS — M5412 Radiculopathy, cervical region: Secondary | ICD-10-CM

## 2014-04-07 ENCOUNTER — Ambulatory Visit (HOSPITAL_COMMUNITY)
Admission: RE | Admit: 2014-04-07 | Discharge: 2014-04-07 | Disposition: A | Discharge status: Home / Self Care | Payer: 59 | Source: Ambulatory Visit | Attending: Neurosurgery | Admitting: Neurosurgery

## 2014-04-07 DIAGNOSIS — M5380 Other specified dorsopathies, site unspecified: Secondary | ICD-10-CM | POA: Diagnosis not present

## 2014-04-07 DIAGNOSIS — R209 Unspecified disturbances of skin sensation: Secondary | ICD-10-CM | POA: Diagnosis present

## 2014-04-07 DIAGNOSIS — M5412 Radiculopathy, cervical region: Secondary | ICD-10-CM | POA: Diagnosis present

## 2014-04-07 DIAGNOSIS — M79609 Pain in unspecified limb: Secondary | ICD-10-CM | POA: Diagnosis present

## 2014-04-17 ENCOUNTER — Emergency Department (HOSPITAL_COMMUNITY)
Admission: EM | Admit: 2014-04-17 | Discharge: 2014-04-17 | Disposition: A | Discharge status: Home / Self Care | Payer: 59 | Source: Home / Self Care | Attending: Emergency Medicine | Admitting: Emergency Medicine

## 2014-04-17 DIAGNOSIS — M5412 Radiculopathy, cervical region: Secondary | ICD-10-CM

## 2014-04-17 MED ORDER — KETOROLAC TROMETHAMINE 60 MG/2ML IM SOLN
60.0000 mg | Freq: Once | INTRAMUSCULAR | Status: AC
  Administered 2014-04-17: 60 mg via INTRAMUSCULAR

## 2014-04-17 MED ORDER — METHYLPREDNISOLONE ACETATE 80 MG/ML IJ SUSP
INTRAMUSCULAR | Status: AC
  Filled 2014-04-17: qty 1

## 2014-04-17 MED ORDER — OXYCODONE-ACETAMINOPHEN 5-325 MG PO TABS: ORAL_TABLET | ORAL | Status: DC

## 2014-04-17 MED ORDER — HYDROCODONE-ACETAMINOPHEN 5-325 MG PO TABS
2.0000 | ORAL_TABLET | Freq: Once | ORAL | Status: AC
  Administered 2014-04-17: 2 via ORAL

## 2014-04-17 MED ORDER — PREDNISONE 20 MG PO TABS: ORAL_TABLET | ORAL | Status: DC

## 2014-04-17 MED ORDER — HYDROCODONE-ACETAMINOPHEN 5-325 MG PO TABS
ORAL_TABLET | ORAL | Status: AC
  Filled 2014-04-17: qty 2

## 2014-04-17 MED ORDER — GABAPENTIN 300 MG PO CAPS: ORAL_CAPSULE | ORAL | Status: DC

## 2014-04-17 MED ORDER — KETOROLAC TROMETHAMINE 60 MG/2ML IM SOLN
INTRAMUSCULAR | Status: AC
Start: 2014-04-17 — End: 2014-04-17
  Filled 2014-04-17: qty 2

## 2014-04-17 MED ORDER — METHYLPREDNISOLONE ACETATE 80 MG/ML IJ SUSP
80.0000 mg | Freq: Once | INTRAMUSCULAR | Status: AC
  Administered 2014-04-17: 80 mg via INTRAMUSCULAR

## 2014-04-17 NOTE — ED Notes (Signed)
43 year old female with right arm pain and tingling

## 2014-04-17 NOTE — ED Provider Notes (Signed)
Chief Complaint   No chief complaint on file.   History of Present Illness   Robin Huffman is a 43 year old female who has had a 2 month history of neck pain with radiation down the right arm. She was seen here when symptoms first began, got some trigger point injections, and was given Vicodin and Flexeril. She was referred to Dr. Newell Coral who did an MRI scan and found nerve or compression at C5-C6 and C6-C7. He offered surgery, but she declined, trying Relafen first. This seemed to help for a while. Over the past several days the pain has gotten worse. She has numbness and tingling of the index and middle finger and weakness of the arm. She doesn't have much pain in her neck. The neck has a full range of motion. She denies any chest pain or shortness of breath.   Review of Systems   Other than noted above, the patient denies any of the following symptoms: Constitutional:  No fever or chills. Neck:  No swelling, or adenopathy.   Cardiac:  No chest pain, tightness, or pressure. Respiratory:  No cough or dyspnea. M-S:  No joint pain, muscle pain, or back problems. Neuro:  No headache, muscle weakness, or paresthesias.  PMFSH   Past medical history, family history, social history, meds, and allergies were reviewed.  Current meds include thyroxine, trazodone, and Celexa. She has a history of hypothyroidism.  Physical Examination    Vital signs:  BP 138/95  Pulse 104  Temp(Src) 98 F (36.7 C) (Oral)  Resp 16  SpO2 97% General:  Alert, oriented and in no distress. ENT:  Pharynx clear, no oral lesions. Neck:  There is no neck tenderness to palpation, the neck has a full range of motion with minimal pain.  Spurling's test was positive. Lungs:  No respiratory distress.  Breath sounds clear and equal bilaterally.  No wheezes, rales or rhonchi. Heart:  Regular rhythm.  No gallops, murmers, or rubs. Ext:  No upper extremity edema, pulses full.  Full ROM of joints with no joint or muscle  pain to palpation. Neuro:  Alert and oriented times 3.  No focal muscle weakness.  DTRs symmetric.  Sensation was diminished to light touch over the right index finger and the dorsum of the hand, sensation of the little finger was intact bilaterally. Skin: Clear, warm and dry.  No rash.  Good capillary refill.  Course in Urgent Care Center   The following medications were given:  Medications  ketorolac (TORADOL) injection 60 mg   methylPREDNISolone acetate (DEPO-MEDROL) injection 80 mg   HYDROcodone-acetaminophen (NORCO/VICODIN) 5-325 MG per tablet 2 tablet    Assessment   The encounter diagnosis was Cervical radiculopathy.  Plan    1.  Meds:  The following meds were prescribed:   New Prescriptions   GABAPENTIN (NEURONTIN) 300 MG CAPSULE    1 daily for 3 days, then 1 BID, then 1 TID   OXYCODONE-ACETAMINOPHEN (PERCOCET) 5-325 MG PER TABLET    1 to 2 tablets every 6 hours as needed for pain.   PREDNISONE (DELTASONE) 20 MG TABLET    Take 3 daily for 5 days, 2 daily for 5 days, 1 daily for 5 days.    2.  Patient Education/Counseling:  The patient was given appropriate handouts, self care instructions, and instructed in pain control.  Given exercises to do twice daily followed by moist heat.    3.  Follow up:  The patient was told to follow up here if no  better in 3 to 4 days, or sooner if becoming worse in any way, and given some red flag symptoms such as worsening pain or new neurological symptoms which would prompt immediate return.       Reuben Likes, MD 04/17/14 [phone]

## 2014-04-17 NOTE — Discharge Instructions (Signed)

## 2014-08-30 ENCOUNTER — Emergency Department (HOSPITAL_COMMUNITY)
Admission: EM | Admit: 2014-08-30 | Discharge: 2014-08-30 | Discharge status: Against Medical Advice | Payer: 59 | Source: Home / Self Care | Attending: Family Medicine | Admitting: Family Medicine

## 2015-08-15 DIAGNOSIS — M1990 Unspecified osteoarthritis, unspecified site: Secondary | ICD-10-CM | POA: Diagnosis not present

## 2015-08-15 DIAGNOSIS — E782 Mixed hyperlipidemia: Secondary | ICD-10-CM | POA: Diagnosis not present

## 2015-08-15 DIAGNOSIS — G4700 Insomnia, unspecified: Secondary | ICD-10-CM | POA: Diagnosis not present

## 2015-08-15 DIAGNOSIS — E039 Hypothyroidism, unspecified: Secondary | ICD-10-CM | POA: Diagnosis not present

## 2015-08-15 DIAGNOSIS — F329 Major depressive disorder, single episode, unspecified: Secondary | ICD-10-CM | POA: Diagnosis not present

## 2015-08-25 ENCOUNTER — Other Ambulatory Visit: Payer: Self-pay | Admitting: Physician Assistant

## 2015-10-26 DIAGNOSIS — Z5181 Encounter for therapeutic drug level monitoring: Secondary | ICD-10-CM | POA: Diagnosis not present

## 2015-11-28 DIAGNOSIS — N959 Unspecified menopausal and perimenopausal disorder: Secondary | ICD-10-CM | POA: Diagnosis not present

## 2015-12-12 DIAGNOSIS — G4700 Insomnia, unspecified: Secondary | ICD-10-CM | POA: Diagnosis not present

## 2015-12-12 DIAGNOSIS — E039 Hypothyroidism, unspecified: Secondary | ICD-10-CM | POA: Diagnosis not present

## 2015-12-12 DIAGNOSIS — M1990 Unspecified osteoarthritis, unspecified site: Secondary | ICD-10-CM | POA: Diagnosis not present

## 2015-12-12 DIAGNOSIS — E782 Mixed hyperlipidemia: Secondary | ICD-10-CM | POA: Diagnosis not present

## 2015-12-12 DIAGNOSIS — F329 Major depressive disorder, single episode, unspecified: Secondary | ICD-10-CM | POA: Diagnosis not present

## 2016-02-08 DIAGNOSIS — Z5181 Encounter for therapeutic drug level monitoring: Secondary | ICD-10-CM | POA: Diagnosis not present

## 2016-03-05 DIAGNOSIS — Z6826 Body mass index (BMI) 26.0-26.9, adult: Secondary | ICD-10-CM | POA: Diagnosis not present

## 2016-03-05 DIAGNOSIS — M1990 Unspecified osteoarthritis, unspecified site: Secondary | ICD-10-CM | POA: Diagnosis not present

## 2016-03-05 DIAGNOSIS — E039 Hypothyroidism, unspecified: Secondary | ICD-10-CM | POA: Diagnosis not present

## 2016-03-05 DIAGNOSIS — G4700 Insomnia, unspecified: Secondary | ICD-10-CM | POA: Diagnosis not present

## 2016-03-05 DIAGNOSIS — F329 Major depressive disorder, single episode, unspecified: Secondary | ICD-10-CM | POA: Diagnosis not present

## 2016-03-05 DIAGNOSIS — E782 Mixed hyperlipidemia: Secondary | ICD-10-CM | POA: Diagnosis not present

## 2016-03-05 DIAGNOSIS — Z1389 Encounter for screening for other disorder: Secondary | ICD-10-CM | POA: Diagnosis not present

## 2016-04-09 DIAGNOSIS — R5383 Other fatigue: Secondary | ICD-10-CM | POA: Diagnosis not present

## 2016-04-09 DIAGNOSIS — M2550 Pain in unspecified joint: Secondary | ICD-10-CM | POA: Diagnosis not present

## 2016-04-09 DIAGNOSIS — M7989 Other specified soft tissue disorders: Secondary | ICD-10-CM | POA: Diagnosis not present

## 2016-04-23 DIAGNOSIS — M2550 Pain in unspecified joint: Secondary | ICD-10-CM | POA: Diagnosis not present

## 2016-04-23 DIAGNOSIS — M1990 Unspecified osteoarthritis, unspecified site: Secondary | ICD-10-CM | POA: Diagnosis not present

## 2016-05-17 DIAGNOSIS — M545 Low back pain: Secondary | ICD-10-CM | POA: Diagnosis not present

## 2016-05-17 DIAGNOSIS — Z6827 Body mass index (BMI) 27.0-27.9, adult: Secondary | ICD-10-CM | POA: Diagnosis not present

## 2016-07-23 DIAGNOSIS — Z6826 Body mass index (BMI) 26.0-26.9, adult: Secondary | ICD-10-CM | POA: Diagnosis not present

## 2016-07-23 DIAGNOSIS — M1990 Unspecified osteoarthritis, unspecified site: Secondary | ICD-10-CM | POA: Diagnosis not present

## 2016-07-23 DIAGNOSIS — E663 Overweight: Secondary | ICD-10-CM | POA: Diagnosis not present

## 2016-07-23 DIAGNOSIS — M2550 Pain in unspecified joint: Secondary | ICD-10-CM | POA: Diagnosis not present

## 2016-08-03 DIAGNOSIS — N390 Urinary tract infection, site not specified: Secondary | ICD-10-CM | POA: Diagnosis not present

## 2016-08-03 MED FILL — HYDROXYCHLOROQUINE 200 MG T: 200 | 90 days supply | Qty: 180 | Fill #0

## 2016-08-24 ENCOUNTER — Ambulatory Visit (INDEPENDENT_AMBULATORY_CARE_PROVIDER_SITE_OTHER): Payer: 59

## 2016-08-24 ENCOUNTER — Encounter (HOSPITAL_COMMUNITY): Payer: Self-pay | Admitting: Emergency Medicine

## 2016-08-24 ENCOUNTER — Ambulatory Visit (HOSPITAL_COMMUNITY)
Admission: EM | Admit: 2016-08-24 | Discharge: 2016-08-24 | Disposition: A | Discharge status: Home / Self Care | Payer: 59 | Attending: Family Medicine | Admitting: Family Medicine

## 2016-08-24 DIAGNOSIS — K5900 Constipation, unspecified: Secondary | ICD-10-CM | POA: Diagnosis not present

## 2016-08-24 DIAGNOSIS — R109 Unspecified abdominal pain: Secondary | ICD-10-CM | POA: Diagnosis not present

## 2016-08-24 LAB — POCT URINALYSIS DIP (DEVICE)
BILIRUBIN URINE: NEGATIVE
GLUCOSE, UA: NEGATIVE mg/dL
Hgb urine dipstick: NEGATIVE
Ketones, ur: NEGATIVE mg/dL
LEUKOCYTES UA: NEGATIVE
Nitrite: NEGATIVE
Protein, ur: NEGATIVE mg/dL
Urobilinogen, UA: 0.2 mg/dL (ref 0.0–1.0)
pH: 6.5 (ref 5.0–8.0)

## 2016-08-24 LAB — POCT PREGNANCY, URINE: PREG TEST UR: NEGATIVE

## 2016-08-24 MED ORDER — POLYETHYLENE GLYCOL 3350 17 GM/SCOOP PO POWD: 1.0000 | Freq: Once | ORAL | 0 refills | Status: AC

## 2016-08-24 NOTE — ED Triage Notes (Signed)
Here for constant abd pain onse 1 week and it radiates to the back pain  Reports it's tender to the touch and will occasionally hurt when she bends  Sx also include: nausea, diarrhea  Denies: fevers, vomiting, urinary sx  A&O x4... NAD

## 2016-08-24 NOTE — Discharge Instructions (Signed)
The x-ray shows moderated mid abdominal constipation in the transverse colon.  Usually, your symptoms will resolve in two to three days with the MiraLax as prescribed three times a day.

## 2016-08-24 NOTE — ED Provider Notes (Signed)
[facility]    CSN: [id number] Arrival date & time: 08/24/16  1507     History   Chief Complaint Chief Complaint  Patient presents with  . Abdominal Pain    HPI Robin Huffman is a 46 y.o. female.   Here for constant abd pain onse 1 week and it radiates to the back pain. When it first started a week ago, he was crampy in nature but now it's steady and central. She's had a little bit of nausea but no vomiting. No fever. She's never had symptoms like this before.  Reports it's tender to the touch and will occasionally hurt when she bends  Sx also include: nausea, diarrhea  Denies: fevers, vomiting, urinary sx  Patient tried Zantac but it did no good.  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      Past Medical History:  Diagnosis Date  . Thyroid disease     There are no active problems to display for this patient.   Past Surgical History:  Procedure Laterality Date  . BTL      OB History    No data available       Home Medications    Prior to Admission medications   Medication Sig Start Date End Date Taking? Authorizing Provider  citalopram (CELEXA) 20 MG tablet Take 20 mg by mouth 2 (two) times daily.   Yes Historical Provider, MD  levothyroxine (SYNTHROID, LEVOTHROID) 150 MCG tablet Take 150 mcg by mouth daily before breakfast.   Yes Historical Provider, MD  Misc Natural Products (PROGESTERONE EX) Apply 1 application topically every evening.   Yes Historical Provider, MD  phentermine 37.5 MG capsule Take 37.5 mg by mouth daily.   Yes Historical Provider, MD  Progesterone Micronized (PROGESTERONE PO) Take 1 tablet by mouth every evening.    Yes Historical Provider, MD  polyethylene glycol powder (MIRALAX) powder Take 255 g by mouth once. 08/24/16 08/24/16  Elvina Sidle, MD  PRESCRIPTION MEDICATION Apply 1 application topically daily.    Historical Provider, MD    Family History History reviewed. No pertinent family  history.  Social History Social History  Substance Use Topics  . Smoking status: Never Smoker  . Smokeless tobacco: Never Used  . Alcohol use Yes     Comment: OCCASIONAL     Allergies   Patient has no known allergies.   Review of Systems Review of Systems  Constitutional: Negative.   HENT: Negative.   Respiratory: Negative.   Gastrointestinal: Positive for abdominal pain and nausea.  Genitourinary: Negative.   Musculoskeletal: Negative.   Neurological: Negative.      Physical Exam Triage Vital Signs ED Triage Vitals  Enc Vitals Group     BP 08/24/16 1609 141/74     Pulse Rate 08/24/16 1609 80     Resp 08/24/16 1609 16     Temp 08/24/16 1609 98.4 F (36.9 C)     Temp Source 08/24/16 1609 Oral     SpO2 08/24/16 1609 99 %     Weight --      Height --      Head Circumference --      Peak Flow --      Pain Score 08/24/16 1612 7     Pain Loc --      Pain Edu? --      Excl. in GC? --    No data found.   Updated Vital Signs BP 141/74 (BP Location: Left Arm)  Pulse 80   Temp 98.4 F (36.9 C) (Oral)   Resp 16   LMP 05/24/2016   SpO2 99%    Physical Exam  Constitutional: She is oriented to person, place, and time. She appears well-developed and well-nourished.  HENT:  Head: Normocephalic.  Right Ear: External ear normal.  Left Ear: External ear normal.  Mouth/Throat: Oropharynx is clear and moist.  Eyes: Conjunctivae and EOM are normal. Pupils are equal, round, and reactive to light.  Neck: Normal range of motion. Neck supple.  Pulmonary/Chest: Effort normal.  Abdominal: Soft. She exhibits no distension. There is tenderness.  Mild diffuse tenderness with deep palpation, worse in epigastrium  Musculoskeletal: Normal range of motion.  Neurological: She is alert and oriented to person, place, and time.  Skin: Skin is warm and dry.  Nursing note and vitals reviewed.    UC Treatments / Results  Labs (all labs ordered are listed, but only abnormal  results are displayed) Labs Reviewed  POCT URINALYSIS DIP (DEVICE)  POCT PREGNANCY, URINE    EKG  EKG Interpretation None       Radiology Marked increase in stool burden Procedures Procedures (including critical care time)  Medications Ordered in UC Medications - No data to display   Initial Impression / Assessment and Plan / UC Course  I have reviewed the triage vital signs and the nursing notes.  Pertinent labs & imaging results that were available during my care of the patient were reviewed by me and considered in my medical decision making (see chart for details).     Final Clinical Impressions(s) / UC Diagnoses   Final diagnoses:  Constipation, unspecified constipation type    New Prescriptions New Prescriptions   POLYETHYLENE GLYCOL POWDER (MIRALAX) POWDER    Take 255 g by mouth once.     Elvina Sidle, MD 08/24/16 1739

## 2016-09-14 MED FILL — CITALOPRAM HBR 40 MG TABLET: 40 | 90 days supply | Qty: 90 | Fill #0

## 2016-09-14 MED FILL — traZODone HCL 50 MG TABS: 50 | 90 days supply | Qty: 180 | Fill #0

## 2016-09-14 MED FILL — ARMOUR THYROID 120 MG TAB: 120 | 90 days supply | Qty: 90 | Fill #0

## 2016-11-19 DIAGNOSIS — M2550 Pain in unspecified joint: Secondary | ICD-10-CM | POA: Diagnosis not present

## 2016-11-19 DIAGNOSIS — M1990 Unspecified osteoarthritis, unspecified site: Secondary | ICD-10-CM | POA: Diagnosis not present

## 2016-11-19 DIAGNOSIS — Z6828 Body mass index (BMI) 28.0-28.9, adult: Secondary | ICD-10-CM | POA: Diagnosis not present

## 2016-12-14 MED FILL — HYDROXYCHLOROQUINE 200 MG T: 200 | 90 days supply | Qty: 180 | Fill #1

## 2016-12-14 MED FILL — CITALOPRAM HBR 40 MG TABLET: 40 | 90 days supply | Qty: 90 | Fill #0

## 2016-12-14 MED FILL — traZODone HCL 50 MG TABS: 50 | 90 days supply | Qty: 180 | Fill #0

## 2017-01-02 ENCOUNTER — Ambulatory Visit (INDEPENDENT_AMBULATORY_CARE_PROVIDER_SITE_OTHER): Payer: 59

## 2017-01-02 ENCOUNTER — Ambulatory Visit (INDEPENDENT_AMBULATORY_CARE_PROVIDER_SITE_OTHER): Payer: 59 | Admitting: Podiatry

## 2017-01-02 ENCOUNTER — Encounter: Payer: Self-pay | Admitting: Podiatry

## 2017-01-02 DIAGNOSIS — M722 Plantar fascial fibromatosis: Secondary | ICD-10-CM

## 2017-01-02 DIAGNOSIS — M21619 Bunion of unspecified foot: Secondary | ICD-10-CM

## 2017-01-02 MED ORDER — TRIAMCINOLONE ACETONIDE 10 MG/ML IJ SUSP
10.0000 mg | Freq: Once | INTRAMUSCULAR | Status: AC
  Administered 2017-01-02: 10 mg

## 2017-01-02 MED ORDER — DICLOFENAC SODIUM 75 MG PO TBEC: 75.0000 mg | DELAYED_RELEASE_TABLET | Freq: Two times a day (BID) | ORAL | 2 refills | Status: DC

## 2017-01-02 NOTE — Patient Instructions (Signed)

## 2017-01-02 NOTE — Progress Notes (Signed)
   Subjective:    Patient ID: Robin Huffman, female    DOB: 25-Sep-1970, 46 y.o.   MRN: [id number]  HPI Chief Complaint  Patient presents with  . Foot Pain    Plantar forefoot/heel bilateral (L>R) - aching for about 3 weeks, tried ice, stretching and relafen-temp relief      Review of Systems  All other systems reviewed and are negative.      Objective:   Physical Exam        Assessment & Plan:

## 2017-01-03 NOTE — Progress Notes (Signed)
Subjective:    Patient ID: Robin Huffman, female   DOB: 46 y.o.   MRN: [id number]   HPI patient states she's developed a lot of pain in the heel left over right and it has gotten worse gradually and has been there for approximately a month. Patient states also forefoot pain but notes that she's walking differently    Review of Systems  All other systems reviewed and are negative.       Objective:  Physical Exam  Constitutional: She is oriented to person, place, and time.  Cardiovascular: Intact distal pulses.   Musculoskeletal: Normal range of motion.  Neurological: She is alert and oriented to person, place, and time.  Skin: Skin is warm.  Nursing note and vitals reviewed.  neurovascular status intact muscle strength was adequate with patient noted to have exquisite discomfort plantar left heel insertional point tendon calcaneus with mild forefoot lateral pain which is most likely compensatory in nature and patient's found have good digital perfusion well oriented 3     Assessment:  Acute plantar fasciitis left with inflammation and probable compensatory pain       Plan:   H&P conditions reviewed and today plantar injection administered left 3 mg Kenalog 5 mill grams Xylocaine and applied fascial brace instructed on physical therapy placed on diclofenac and reappoint to recheck in 2 weeks  X-rays indicate spur with no indication of stress fracture or arthritis

## 2017-01-17 ENCOUNTER — Ambulatory Visit: Payer: 59 | Admitting: Podiatry

## 2017-03-13 MED FILL — HYDROXYCHLOROQUINE 200 MG T: 200 | 90 days supply | Qty: 180 | Fill #2

## 2017-04-15 DIAGNOSIS — N959 Unspecified menopausal and perimenopausal disorder: Secondary | ICD-10-CM | POA: Diagnosis not present

## 2017-04-15 DIAGNOSIS — E039 Hypothyroidism, unspecified: Secondary | ICD-10-CM | POA: Diagnosis not present

## 2017-04-15 DIAGNOSIS — E663 Overweight: Secondary | ICD-10-CM | POA: Diagnosis not present

## 2017-04-15 MED FILL — PHENTERMINE 37.5 MG TABLET: 37.5 | 30 days supply | Qty: 30 | Fill #0

## 2017-04-17 DIAGNOSIS — Z5181 Encounter for therapeutic drug level monitoring: Secondary | ICD-10-CM | POA: Diagnosis not present

## 2017-04-25 MED FILL — traZODone HCL 50 MG TABS: 50 | 90 days supply | Qty: 180 | Fill #0

## 2017-05-16 MED FILL — CITALOPRAM HBR 40 MG TABLET: 40 | 90 days supply | Qty: 90 | Fill #0

## 2017-05-24 MED FILL — PHENTERMINE 37.5 MG TABLET: 37.5 | 30 days supply | Qty: 30 | Fill #1

## 2017-06-12 DIAGNOSIS — Z5181 Encounter for therapeutic drug level monitoring: Secondary | ICD-10-CM | POA: Diagnosis not present

## 2017-07-11 MED FILL — PHENTERMINE 37.5 MG TABLET: 37.5 | 30 days supply | Qty: 30 | Fill #2

## 2017-07-11 MED FILL — HYDROXYCHLOROQUINE 200 MG: 200 | 90 days supply | Qty: 180 | Fill #3

## 2017-07-15 DIAGNOSIS — E039 Hypothyroidism, unspecified: Secondary | ICD-10-CM | POA: Diagnosis not present

## 2017-07-15 DIAGNOSIS — G4700 Insomnia, unspecified: Secondary | ICD-10-CM | POA: Diagnosis not present

## 2017-07-15 DIAGNOSIS — F329 Major depressive disorder, single episode, unspecified: Secondary | ICD-10-CM | POA: Diagnosis not present

## 2017-07-15 DIAGNOSIS — M1990 Unspecified osteoarthritis, unspecified site: Secondary | ICD-10-CM | POA: Diagnosis not present

## 2017-07-15 DIAGNOSIS — E782 Mixed hyperlipidemia: Secondary | ICD-10-CM | POA: Diagnosis not present

## 2017-07-15 DIAGNOSIS — Z6829 Body mass index (BMI) 29.0-29.9, adult: Secondary | ICD-10-CM | POA: Diagnosis not present

## 2017-07-15 MED FILL — ARMOUR THYROID 120 MG TAB: 120 | 30 days supply | Qty: 30 | Fill #0

## 2017-07-16 MED FILL — traZODone HCL 50 MG TABS: 50 | 90 days supply | Qty: 180 | Fill #0

## 2017-08-08 DIAGNOSIS — Z5181 Encounter for therapeutic drug level monitoring: Secondary | ICD-10-CM | POA: Diagnosis not present

## 2017-08-12 DIAGNOSIS — E039 Hypothyroidism, unspecified: Secondary | ICD-10-CM | POA: Diagnosis not present

## 2017-08-12 DIAGNOSIS — E663 Overweight: Secondary | ICD-10-CM | POA: Diagnosis not present

## 2017-08-13 DIAGNOSIS — E039 Hypothyroidism, unspecified: Secondary | ICD-10-CM | POA: Diagnosis not present

## 2017-09-03 DIAGNOSIS — E559 Vitamin D deficiency, unspecified: Secondary | ICD-10-CM | POA: Diagnosis not present

## 2017-09-03 DIAGNOSIS — E039 Hypothyroidism, unspecified: Secondary | ICD-10-CM | POA: Diagnosis not present

## 2017-09-03 DIAGNOSIS — R197 Diarrhea, unspecified: Secondary | ICD-10-CM | POA: Diagnosis not present

## 2017-09-03 MED FILL — CITALOPRAM HBR 40 MG TABLET: 40 | 90 days supply | Qty: 90 | Fill #0

## 2017-09-03 MED FILL — PHENTERMINE 37.5 MG TABLET: 37.5 | 30 days supply | Qty: 30 | Fill #3

## 2017-09-16 MED FILL — ARMOUR THYROID 120 MG TAB: 120 | 90 days supply | Qty: 90 | Fill #0

## 2017-10-02 MED FILL — traZODone HCL 50 MG TABS: 50 | 90 days supply | Qty: 180 | Fill #0

## 2017-10-08 MED FILL — PHENTERMINE 37.5 MG TABLET: 37.5 | 30 days supply | Qty: 30 | Fill #4

## 2017-11-11 DIAGNOSIS — E039 Hypothyroidism, unspecified: Secondary | ICD-10-CM | POA: Diagnosis not present

## 2017-11-11 DIAGNOSIS — I10 Essential (primary) hypertension: Secondary | ICD-10-CM | POA: Diagnosis not present

## 2017-11-11 DIAGNOSIS — N959 Unspecified menopausal and perimenopausal disorder: Secondary | ICD-10-CM | POA: Diagnosis not present

## 2017-11-11 DIAGNOSIS — E663 Overweight: Secondary | ICD-10-CM | POA: Diagnosis not present

## 2017-12-03 MED FILL — PHENTERMINE 37.5 MG TABLET: 37.5 | 30 days supply | Qty: 30 | Fill #0

## 2017-12-03 MED FILL — CITALOPRAM HBR 40 MG TABLET: 40 | 90 days supply | Qty: 90 | Fill #0

## 2017-12-06 DIAGNOSIS — Z5181 Encounter for therapeutic drug level monitoring: Secondary | ICD-10-CM | POA: Diagnosis not present

## 2017-12-06 MED FILL — HYDROCHLOROTHIAZIDE 25 MG T: 25 | 90 days supply | Qty: 90 | Fill #0

## 2017-12-31 MED FILL — traZODone HCL 50 MG TABS: 50 | 90 days supply | Qty: 180 | Fill #1

## 2018-01-15 MED FILL — ARMOUR THYROID 120 MG TAB: 120 | 90 days supply | Qty: 90 | Fill #1

## 2018-01-15 MED FILL — PHENTERMINE 37.5 MG TABLET: 37.5 | 30 days supply | Qty: 30 | Fill #1

## 2018-02-11 DIAGNOSIS — L089 Local infection of the skin and subcutaneous tissue, unspecified: Secondary | ICD-10-CM | POA: Diagnosis not present

## 2018-02-17 DIAGNOSIS — E039 Hypothyroidism, unspecified: Secondary | ICD-10-CM | POA: Diagnosis not present

## 2018-02-17 DIAGNOSIS — N959 Unspecified menopausal and perimenopausal disorder: Secondary | ICD-10-CM | POA: Diagnosis not present

## 2018-02-17 DIAGNOSIS — E663 Overweight: Secondary | ICD-10-CM | POA: Diagnosis not present

## 2018-02-21 MED FILL — AZITHROMYCIN 500 MG TABLET: 500 | 5 days supply | Qty: 5 | Fill #0

## 2018-02-21 MED FILL — PHENTERMINE 37.5 MG TABLET: 37.5 | 30 days supply | Qty: 30 | Fill #2

## 2018-03-20 MED FILL — traZODone HCL 50 MG TABS: 50 | 30 days supply | Qty: 60 | Fill #0

## 2018-03-20 MED FILL — CITALOPRAM HBR 40 MG TABLET: 40 | 30 days supply | Qty: 30 | Fill #0

## 2018-03-31 DIAGNOSIS — Z6826 Body mass index (BMI) 26.0-26.9, adult: Secondary | ICD-10-CM | POA: Diagnosis not present

## 2018-03-31 DIAGNOSIS — M069 Rheumatoid arthritis, unspecified: Secondary | ICD-10-CM | POA: Diagnosis not present

## 2018-03-31 DIAGNOSIS — G4700 Insomnia, unspecified: Secondary | ICD-10-CM | POA: Diagnosis not present

## 2018-03-31 DIAGNOSIS — E039 Hypothyroidism, unspecified: Secondary | ICD-10-CM | POA: Diagnosis not present

## 2018-03-31 DIAGNOSIS — F329 Major depressive disorder, single episode, unspecified: Secondary | ICD-10-CM | POA: Diagnosis not present

## 2018-03-31 DIAGNOSIS — E782 Mixed hyperlipidemia: Secondary | ICD-10-CM | POA: Diagnosis not present

## 2018-04-03 MED FILL — HYDROCHLOROTHIAZIDE 25 MG T: 25 | 90 days supply | Qty: 90 | Fill #1

## 2018-04-03 MED FILL — PHENTERMINE 37.5 MG TABLET: 37.5 | 30 days supply | Qty: 30 | Fill #3

## 2018-04-22 MED FILL — traZODone HCL 50 MG TABS: 50 | 30 days supply | Qty: 60 | Fill #0

## 2018-04-22 MED FILL — CITALOPRAM HBR 40 MG TABLET: 40 | 30 days supply | Qty: 30 | Fill #0

## 2018-05-21 ENCOUNTER — Encounter (HOSPITAL_COMMUNITY): Payer: Self-pay

## 2018-05-21 ENCOUNTER — Emergency Department (HOSPITAL_COMMUNITY)
Admission: EM | Admit: 2018-05-21 | Discharge: 2018-05-21 | Disposition: A | Discharge status: Home / Self Care | Payer: 59 | Attending: Emergency Medicine | Admitting: Emergency Medicine

## 2018-05-21 ENCOUNTER — Emergency Department (HOSPITAL_COMMUNITY): Payer: 59

## 2018-05-21 ENCOUNTER — Other Ambulatory Visit: Payer: Self-pay

## 2018-05-21 DIAGNOSIS — R0789 Other chest pain: Secondary | ICD-10-CM | POA: Insufficient documentation

## 2018-05-21 DIAGNOSIS — R079 Chest pain, unspecified: Secondary | ICD-10-CM | POA: Diagnosis not present

## 2018-05-21 DIAGNOSIS — R000 Tachycardia, unspecified: Secondary | ICD-10-CM | POA: Insufficient documentation

## 2018-05-21 DIAGNOSIS — Z79899 Other long term (current) drug therapy: Secondary | ICD-10-CM | POA: Diagnosis not present

## 2018-05-21 DIAGNOSIS — I10 Essential (primary) hypertension: Secondary | ICD-10-CM | POA: Insufficient documentation

## 2018-05-21 DIAGNOSIS — E039 Hypothyroidism, unspecified: Secondary | ICD-10-CM | POA: Insufficient documentation

## 2018-05-21 HISTORY — DX: Major depressive disorder, single episode, unspecified: F32.9

## 2018-05-21 HISTORY — DX: Personal history of other medical treatment: Z92.89

## 2018-05-21 HISTORY — DX: Essential (primary) hypertension: I10

## 2018-05-21 HISTORY — DX: Nonspecific intraventricular block: I45.4

## 2018-05-21 HISTORY — DX: Depression, unspecified: F32.A

## 2018-05-21 LAB — BASIC METABOLIC PANEL
Anion gap: 9 (ref 5–15)
BUN: 16 mg/dL (ref 6–20)
CALCIUM: 9.3 mg/dL (ref 8.9–10.3)
CHLORIDE: 101 mmol/L (ref 98–111)
CO2: 26 mmol/L (ref 22–32)
Creatinine, Ser: 0.87 mg/dL (ref 0.44–1.00)
GFR calc non Af Amer: >60 mL/min (ref >60)
GLUCOSE: 150 mg/dL — AB (ref 70–99)
Potassium: 3.6 mmol/L (ref 3.5–5.1)
Sodium: 136 mmol/L (ref 135–145)

## 2018-05-21 LAB — CBC
HEMATOCRIT: 41.9 % (ref 36.0–46.0)
Hemoglobin: 14 g/dL (ref 12.0–15.0)
MCH: 30.3 pg (ref 26.0–34.0)
MCHC: 33.4 g/dL (ref 30.0–36.0)
MCV: 90.7 fL (ref 80.0–100.0)
PLATELETS: 298 10*3/uL (ref 150–400)
RBC: 4.62 MIL/uL (ref 3.87–5.11)
RDW: 16.1 % — AB (ref 11.5–15.5)
WBC: 8.9 10*3/uL (ref 4.0–10.5)
nRBC: 0 % (ref 0.0–0.2)

## 2018-05-21 LAB — TROPONIN I: Troponin I: <0.03 ng/mL (ref <0.03)

## 2018-05-21 LAB — MAGNESIUM: Magnesium: 2 mg/dL (ref 1.7–2.4)

## 2018-05-21 LAB — D-DIMER, QUANTITATIVE: D-Dimer, Quant: 0.45 ug/mL-FEU (ref 0.00–0.50)

## 2018-05-21 MED ORDER — OXYCODONE-ACETAMINOPHEN 5-325 MG PO TABS: 1.0000 | ORAL_TABLET | Freq: Once | ORAL | Status: DC

## 2018-05-21 MED ORDER — ONDANSETRON HCL 4 MG/2ML IJ SOLN
4.0000 mg | Freq: Once | INTRAMUSCULAR | Status: AC | PRN
  Administered 2018-05-21: 4 mg via INTRAVENOUS
  Filled 2018-05-21: qty 2

## 2018-05-21 MED ORDER — SODIUM CHLORIDE 0.9 % IV BOLUS
1000.0000 mL | Freq: Once | INTRAVENOUS | Status: AC
  Administered 2018-05-21: 1000 mL via INTRAVENOUS

## 2018-05-21 MED ORDER — MORPHINE SULFATE (PF) 4 MG/ML IV SOLN
4.0000 mg | INTRAVENOUS | Status: DC | PRN
  Administered 2018-05-21: 4 mg via INTRAVENOUS
  Filled 2018-05-21: qty 1

## 2018-05-21 NOTE — ED Notes (Signed)
Phlebotomy at bedside.

## 2018-05-21 NOTE — ED Provider Notes (Signed)
[facility] EMERGENCY DEPARTMENT Provider Note   CSN: [id number] Arrival date & time: 05/21/18  1145     History   Chief Complaint Chief Complaint  Patient presents with  . Chest Pain    HPI Robin Huffman is a 47 y.o. female.  HPI  Pt was seen at 1215. Per pt, c/o gradual onset and persistence of constant left sided chest "pain" that began approximately 0400 this morning. Pt states she was already awake when her discomfort began. Pt describes the discomfort as "burning." Has been associated with nausea. Denies SOB/cough, no palpitations, no abd pain, no vomiting/diarrhea, no back pain, no rash, no fevers, no injury.   Past Medical History:  Diagnosis Date  . BBB (bundle branch block)   . Depression   . History of cardiovascular stress test 2014   normal myoview  . Hypertension   . Thyroid disease    hypothyroid    There are no active problems to display for this patient.   Past Surgical History:  Procedure Laterality Date  . BTL    . CERVICAL FUSION    . TUBAL LIGATION       OB History   None      Home Medications    Prior to Admission medications   Medication Sig Start Date End Date Taking? Authorizing Provider  citalopram (CELEXA) 40 MG tablet Take 1 tablet by mouth at bedtime. 04/22/18  Yes [provider]  diclofenac (VOLTAREN) 75 MG EC tablet Take 1 tablet (75 mg total) by mouth 2 (two) times daily. 01/02/17   Lenn Sink, DPM  hydrochlorothiazide (HYDRODIURIL) 25 MG tablet Take 25 mg by mouth daily. 04/03/18   [provider]  hydroxychloroquine (PLAQUENIL) 200 MG tablet TAKE 1 TABLET BY MOUTH TWICE DAILY WITH FOOD OR MILK 12/14/16   [provider]  levothyroxine (SYNTHROID, LEVOTHROID) 150 MCG tablet Take 150 mcg by mouth daily before breakfast.    [provider]  phentermine (ADIPEX-P) 37.5 MG tablet Take 37.5 mg by mouth daily. 04/03/18   [provider]  traZODone (DESYREL) 50 MG tablet Take 100 mg by  mouth at bedtime. 12/14/16   [provider]    Family History No family history on file.  Social History Social History   Tobacco Use  . Smoking status: Never Smoker  . Smokeless tobacco: Never Used  Substance Use Topics  . Alcohol use: Yes    Comment: OCCASIONAL  . Drug use: Not on file     Allergies   Patient has no known allergies.   Review of Systems Review of Systems ROS: Statement: All systems negative except as marked or noted in the HPI; Constitutional: Negative for fever and chills. ; ; Eyes: Negative for eye pain, redness and discharge. ; ; ENMT: Negative for ear pain, hoarseness, nasal congestion, sinus pressure and sore throat. ; ; Cardiovascular: +CP. Negative for palpitations, diaphoresis, dyspnea and peripheral edema. ; ; Respiratory: Negative for cough, wheezing and stridor. ; ; Gastrointestinal: +nausea. Negative for vomiting, diarrhea, abdominal pain, blood in stool, hematemesis, jaundice and rectal bleeding. . ; ; Genitourinary: Negative for dysuria, flank pain and hematuria. ; ; Musculoskeletal: Negative for back pain and neck pain. Negative for swelling and trauma.; ; Skin: Negative for pruritus, rash, abrasions, blisters, bruising and skin lesion.; ; Neuro: Negative for headache, lightheadedness and neck stiffness. Negative for weakness, altered level of consciousness, altered mental status, extremity weakness, paresthesias, involuntary movement, seizure and syncope.  Physical Exam Updated Vital Signs BP (!) 172/89   Pulse (!) 122   Temp 98 F (36.7 C) (Oral)   Resp 16   Ht 5\' 8"  (1.727 m)   Wt 75.8 kg   SpO2 94%   BMI 25.39 kg/m    Patient Vitals for the past 24 hrs:  BP Temp Temp src Pulse Resp SpO2 Height Weight  05/21/18 1430 (!) 151/101 - - 90 15 99 % - -  05/21/18 1400 (!) 142/78 - - 94 16 98 % - -  05/21/18 1330 (!) 155/101 - - 94 18 98 % - -  05/21/18 1300 (!) 161/103 - - (!) 104 16 97 % - -  05/21/18 1246 - - - (!) 122 16 94  % - -  05/21/18 1244 (!) 172/89 - - - - - - -  05/21/18 1159 (!) 167/92 98 F (36.7 C) Oral (!) 115 18 96 % - -  05/21/18 1157 - - - - - - 5\' 8"  (1.727 m) 75.8 kg     Physical Exam 1220: Physical examination:  Nursing notes reviewed; Vital signs and O2 SAT reviewed;  Constitutional: Well developed, Well nourished, Well hydrated, In no acute distress; Head:  Normocephalic, atraumatic; Eyes: EOMI, PERRL, No scleral icterus; ENMT: Mouth and pharynx normal, Mucous membranes moist; Neck: Supple, Full range of motion, No lymphadenopathy; Cardiovascular: Tachycardic rate and rhythm, No gallop; Respiratory: Breath sounds clear & equal bilaterally, No wheezes.  Speaking full sentences with ease, Normal respiratory effort/excursion; Chest: +left parasternal and upper anterior chest wall tender to palp. No deformity, no soft tissue crepitus. Movement normal; Abdomen: Soft, Nontender, Nondistended, Normal bowel sounds; Genitourinary: No CVA tenderness; Extremities: Peripheral pulses normal, No tenderness, No edema, No calf edema or asymmetry.; Neuro: AA&Ox3, Major CN grossly intact.  Speech clear. No gross focal motor or sensory deficits in extremities.; Skin: Color normal, Warm, Dry.   ED Treatments / Results  Labs (all labs ordered are listed, but only abnormal results are displayed)   EKG EKG Interpretation  Date/Time:  Wednesday May 21 2018 12:00:07 EST Ventricular Rate:  117 PR Interval:  104 QRS Duration: 76 QT Interval:  432 QTC Calculation: 602 R Axis:   42 Text Interpretation:  Critical Test Result: Long QTc Sinus tachycardia with short PR Nonspecific T wave abnormality Prolonged QT When compared with ECG of 06/23/2013 QT has lengthened Rate faster Confirmed by 08-03-1971 [id number]) on 05/21/2018 12:15:10 PM     EKG Interpretation  Date/Time:  Wednesday May 21 2018 14:43:17 EST Ventricular Rate:  78 PR Interval:  104 QRS Duration: 83 QT Interval:  399 QTC  Calculation: 455 R Axis:   62 Text Interpretation:  Sinus rhythm RSR' in V1 or V2, right VCD or RVH Nonspecific T abnormalities, anterior leads When compared with ECG of 06/23/2013 No significant change was found Confirmed by 08-03-1971 [phone]) on 05/21/2018 2:51:01 PM         Radiology   Procedures Procedures (including critical care time)  Medications Ordered in ED Medications  sodium chloride 0.9 % bolus 1,000 mL (1,000 mLs Intravenous New Bag/Given 05/21/18 1242)  morphine 4 MG/ML injection 4 mg (4 mg Intravenous Given 05/21/18 1242)  ondansetron (ZOFRAN) injection 4 mg (4 mg Intravenous Given 05/21/18 1249)     Initial Impression / Assessment and Plan / ED Course  I have reviewed the triage vital signs and the nursing notes.  Pertinent labs & imaging results that were available during my care of the  patient were reviewed by me and considered in my medical decision making (see chart for details).   MDM Reviewed: previous chart, nursing note and vitals Reviewed previous: labs and ECG Interpretation: labs, ECG and x-ray   Results for orders placed or performed during the hospital encounter of 05/21/18  Basic metabolic panel  Result Value Ref Range   Sodium 136 135 - 145 mmol/L   Potassium 3.6 3.5 - 5.1 mmol/L   Chloride 101 98 - 111 mmol/L   CO2 26 22 - 32 mmol/L   Glucose, Bld 150 (H) 70 - 99 mg/dL   BUN 16 6 - 20 mg/dL   Creatinine, Ser 0.81 0.44 - 1.00 mg/dL   Calcium 9.3 8.9 - 44.8 mg/dL   GFR calc non Af Amer >60 >60 mL/min   GFR calc Af Amer >60 >60 mL/min   Anion gap 9 5 - 15  CBC  Result Value Ref Range   WBC 8.9 4.0 - 10.5 K/uL   RBC 4.62 3.87 - 5.11 MIL/uL   Hemoglobin 14.0 12.0 - 15.0 g/dL   HCT 18.5 63.1 - 49.7 %   MCV 90.7 80.0 - 100.0 fL   MCH 30.3 26.0 - 34.0 pg   MCHC 33.4 30.0 - 36.0 g/dL   RDW 02.6 (H) 37.8 - 58.8 %   Platelets 298 150 - 400 K/uL   nRBC 0.0 0.0 - 0.2 %  Troponin I - ONCE - STAT  Result Value Ref Range    Troponin I <0.03 <0.03 ng/mL  Magnesium  Result Value Ref Range   Magnesium 2.0 1.7 - 2.4 mg/dL  D-dimer, quantitative  Result Value Ref Range   D-Dimer, Quant 0.45 0.00 - 0.50 ug/mL-FEU   Dg Chest 2 View Result Date: 05/21/2018 CLINICAL DATA:  Chest pain.  Hypertension. EXAM: CHEST - 2 VIEW COMPARISON:  June 23, 2013 FINDINGS: No edema or consolidation. The heart size and pulmonary vascularity are normal. No adenopathy. Postoperative changes noted in the lower cervical region. No pneumothorax. IMPRESSION: No edema or consolidation. Electronically Signed   By: Bretta Bang III M.D.   On: 05/21/2018 12:21     1300:  ED RN ordered/gave IV zofran per RN protocol, this was not ordered by EDP.   1500:  Doubt PE as cause for symptoms with normal d-dimer and low risk [place].  Doubt ACS as cause for symptoms with normal troponin and unchanged EKG from previous after nearly 9 hours of constant atypical symptoms and pt does have hx normal myoview in 2014. Tachycardia improved after pain meds and IVF. Pt states now she "might have had anxiety" as cause for her symptoms. Tx symptomatically at this time, f/u Cards MD. Dx and testing d/w pt and family.  Questions answered.  Verb understanding, agreeable to d/c home with outpt f/u.      Final Clinical Impressions(s) / ED Diagnoses   Final diagnoses:  None    ED Discharge Orders    None       Samuel Jester, DO 05/26/18 1614

## 2018-05-21 NOTE — Discharge Instructions (Addendum)
Avoid avoid caffinated products, such as teas, colas, coffee, chocolate. Avoid over the counter cold medicines, herbal or "natural vitamin" products, and illicit drugs because they can contain stimulants. Take your usual prescriptions as previously directed.  Call your regular medical doctor today to schedule a follow up appointment this week. Call the Cardiologist today to schedule a follow up appointment within the next week.  Return to the Emergency Department immediately if worsening.

## 2018-05-21 NOTE — ED Triage Notes (Signed)
Pt transported from [facility] urgent care due to CP. Pain started at 4 am. Pain located left side of chest. Reports nausea and lightheaded. Pt adm 324 asa and has 18 g to right forearm with NS KVO

## 2018-05-26 DIAGNOSIS — Z6825 Body mass index (BMI) 25.0-25.9, adult: Secondary | ICD-10-CM | POA: Diagnosis not present

## 2018-05-26 DIAGNOSIS — F419 Anxiety disorder, unspecified: Secondary | ICD-10-CM | POA: Diagnosis not present

## 2018-05-26 DIAGNOSIS — R030 Elevated blood-pressure reading, without diagnosis of hypertension: Secondary | ICD-10-CM | POA: Diagnosis not present

## 2018-05-27 MED FILL — PHENTERMINE 37.5 MG TABLET: 37.5 | 30 days supply | Qty: 30 | Fill #4

## 2018-05-27 MED FILL — ARMOUR THYROID 120 MG TAB: 120 | 90 days supply | Qty: 90 | Fill #2

## 2018-05-27 MED FILL — traZODone HCL 50 MG TABS: 50 | 30 days supply | Qty: 60 | Fill #1

## 2018-05-27 MED FILL — CITALOPRAM HBR 40 MG TABLET: 40 | 30 days supply | Qty: 30 | Fill #1

## 2018-05-28 MED FILL — clonazePAM 0.5 MG TABS: 0.5 | 30 days supply | Qty: 60 | Fill #0

## 2018-05-30 DIAGNOSIS — Z5181 Encounter for therapeutic drug level monitoring: Secondary | ICD-10-CM | POA: Diagnosis not present

## 2018-06-16 DIAGNOSIS — E663 Overweight: Secondary | ICD-10-CM | POA: Diagnosis not present

## 2018-06-16 DIAGNOSIS — E039 Hypothyroidism, unspecified: Secondary | ICD-10-CM | POA: Diagnosis not present

## 2018-06-16 DIAGNOSIS — I10 Essential (primary) hypertension: Secondary | ICD-10-CM | POA: Diagnosis not present

## 2018-06-27 MED FILL — PHENTERMINE 37.5 MG TABLET: 37.5 | 30 days supply | Qty: 30 | Fill #0

## 2018-06-27 MED FILL — traZODone HCL 50 MG TABS: 50 | 30 days supply | Qty: 60 | Fill #0

## 2018-06-27 MED FILL — CITALOPRAM HBR 40 MG TABLET: 40 | 30 days supply | Qty: 30 | Fill #0

## 2018-06-30 ENCOUNTER — Ambulatory Visit: Payer: 59 | Admitting: Cardiology

## 2018-07-29 MED FILL — HYDROCHLOROTHIAZIDE 25 MG T: 25 | 90 days supply | Qty: 90 | Fill #2

## 2018-07-29 MED FILL — traZODone HCL 50 MG TABS: 50 | 30 days supply | Qty: 60 | Fill #1

## 2018-07-29 MED FILL — CITALOPRAM HBR 40 MG TABLET: 40 | 30 days supply | Qty: 30 | Fill #1

## 2018-08-01 ENCOUNTER — Other Ambulatory Visit (HOSPITAL_COMMUNITY): Payer: Self-pay | Admitting: Obstetrics and Gynecology

## 2018-08-01 DIAGNOSIS — Z1231 Encounter for screening mammogram for malignant neoplasm of breast: Secondary | ICD-10-CM

## 2018-08-11 ENCOUNTER — Encounter: Payer: Self-pay | Admitting: *Deleted

## 2018-08-11 DIAGNOSIS — R7309 Other abnormal glucose: Secondary | ICD-10-CM | POA: Diagnosis not present

## 2018-08-11 DIAGNOSIS — E039 Hypothyroidism, unspecified: Secondary | ICD-10-CM | POA: Diagnosis not present

## 2018-08-11 DIAGNOSIS — E559 Vitamin D deficiency, unspecified: Secondary | ICD-10-CM | POA: Diagnosis not present

## 2018-08-18 ENCOUNTER — Ambulatory Visit (HOSPITAL_COMMUNITY)
Admission: RE | Admit: 2018-08-18 | Discharge: 2018-08-18 | Disposition: A | Discharge status: Home / Self Care | Payer: 59 | Source: Ambulatory Visit | Attending: Obstetrics and Gynecology | Admitting: Obstetrics and Gynecology

## 2018-08-18 DIAGNOSIS — Z1231 Encounter for screening mammogram for malignant neoplasm of breast: Secondary | ICD-10-CM | POA: Diagnosis not present

## 2018-08-18 MED FILL — NP THYROID 120 MG TABLET: 120 | 90 days supply | Qty: 90 | Fill #0

## 2018-08-18 MED FILL — NP THYROID 30 MG TABLET: 30 | 90 days supply | Qty: 90 | Fill #0

## 2018-08-27 MED FILL — PHENTERMINE 37.5 MG TABLET: 37.5 | 30 days supply | Qty: 30 | Fill #1

## 2018-08-27 MED FILL — valACYclovir HCL 1 GM TABS: 1 | 30 days supply | Qty: 30 | Fill #0

## 2018-08-27 MED FILL — traZODone HCL 50 MG TABS: 50 | 30 days supply | Qty: 60 | Fill #2

## 2018-09-01 ENCOUNTER — Ambulatory Visit: Payer: 59 | Admitting: Cardiology

## 2018-09-22 DIAGNOSIS — R6889 Other general symptoms and signs: Secondary | ICD-10-CM | POA: Diagnosis not present

## 2018-09-22 DIAGNOSIS — J020 Streptococcal pharyngitis: Secondary | ICD-10-CM | POA: Diagnosis not present

## 2018-09-23 MED FILL — traZODone HCL 50 MG TABS: 50 | 30 days supply | Qty: 60 | Fill #0 | Status: TO

## 2018-09-23 MED FILL — CITALOPRAM HBR 40 MG TABLET: 40 | 30 days supply | Qty: 30 | Fill #2 | Status: TO

## 2018-09-25 ENCOUNTER — Telehealth: Payer: 59 | Admitting: Family

## 2018-09-25 DIAGNOSIS — R0600 Dyspnea, unspecified: Secondary | ICD-10-CM

## 2018-09-25 MED ORDER — PROMETHAZINE-DM 6.25-15 MG/5ML PO SYRP: 5.0000 mL | ORAL_SOLUTION | Freq: Four times a day (QID) | ORAL | 0 refills | Status: AC | PRN

## 2018-09-25 NOTE — Progress Notes (Signed)
Based on what you shared with me, I feel that you are considered high risk for Corona virus virus because of a known exposure, fever, shortness of breath and cough.  You should proceed to our testing site at [address] [place] [address].   I have placed an order for you to have the cornoavirus (COVID19) test done.  - You will be tested for (COVID-19) and discharged home on quarantine except to seek medical care if symptoms worsen, and asked to  - Stay home and avoid contact with others until you get your results (4-5 days)  - Avoid travel on public transportation if possible (such as bus, train, or airplane)  Continue to monitor at home and seek medical attention if your symptoms worsen.  If you are having a medical emergency, call 911.  You can use medications such as A prescription cough medication called Phenergan DM 6.25 mg/15 mg. You make take one teaspoon / 5 ml every 4-6 hours as needed for cough  Please review the forms below as these are required. PRINT, sign and complete and bring with you if possible to the testing site.     Person Under Monitoring Name: Robin Huffman  Location: [address] [place] [address]   CORONAVIRUS DISEASE 2019 (COVID-19) Guidance for Persons Under Investigation You are being tested for the virus that causes coronavirus disease 2019 (COVID-19). Public health actions are necessary to ensure protection of your health and the health of others, and to prevent further spread of infection. COVID-19 is caused by a virus that can cause symptoms, such as fever, cough, and shortness of breath. The primary transmission from person to person is by coughing or sneezing. On August 07, 2018, the World Health Organization announced a [employer] Emergency of International Concern and on August 08, 2018 the U.S. Department of Health and Human Services declared a public health emergency. If the virus that causesCOVID-19 spreads in the community,  it could have severe public health consequences.  As a person under investigation for COVID-19, the [employer] of Health and [employer], Division of [employer] advises you to adhere to the following guidance until your test results are reported to you. If your test result is positive, you will receive additional information from your provider and your local health department at that time.   Remain at home until you are cleared by your health provider or public health authorities.   Keep a log of visitors to your home using the form provided. Any visitors to your home must be aware of your isolation status.  If you plan to move to a new address or leave the county, notify the local health department in your county.  Call a doctor or seek care if you have an urgent medical need. Before seeking medical care, call ahead and get instructions from the provider before arriving at the medical office, clinic or hospital. Notify them that you are being tested for the virus that causes COVID-19 so arrangements can be made, as necessary, to prevent transmission to others in the healthcare setting. Next, notify the local health department in your county.  If a medical emergency arises and you need to call 911, inform the first responders that you are being tested for the virus that causes COVID-19. Next, notify the local health department in your county.  Adhere to all guidance set forth by the [employer] of [employer] for [facility] of patients that is  based on guidance from the Center for Disease Control and Prevention with suspected or confirmed COVID-19. It is provided with this guidance for Persons Under Investigation.  Your health and the health of our community are our top priorities. Public Health officials remain available to provide assistance and counseling to you about COVID-19 and compliance with this guidance.  Provider: Worthy Rancher, DNP,  FNP-BC_______________________________________________ Date: __03____/_19____/_2020____  By signing below, you acknowledge that you have read and agree to comply with this Guidance for Persons Under Investigation. ______________________________________________________________ Date: ______/_____/_________  WHO DO I CALL? You can find a list of local health departments here: [email or web address].[email or web address] Health Department: ____________________________________________________________________ Contact Name: ________________________________________________________________________ Telephone: ___________________________________________________________________________  Nedra Hai, Division of Public Health, Communicable Disease Branch COVID-19 Guidance for Persons Under Investigation September 13, 2018   Person Under Monitoring Name: Robin Huffman  Location: [address] [place] [address]   Record here the list of visitors to your home since you became ill with respiratory symptoms that led you to consult a health provider:  Visitor Name Date Time In Time Out Did this person come within 6 feet of you? Indicate Y or N Relationship to Person Under Monitoring Phone number Comments   ___/____/____ __:__ AM/PM __:__ AM/PM       ___/____/____ __:__ AM/PM __:__ AM/PM       ___/____/____ __:__ AM/PM __:__ AM/PM       ___/____/____ __:__ AM/PM __:__ AM/PM       ___/____/____ __:__ AM/PM __:__ AM/PM       ___/____/____ __:__ AM/PM __:__ AM/PM       ___/____/____ __:__ AM/PM __:__ AM/PM       ___/____/____ __:__ AM/PM __:__ AM/PM       ___/____/____ __:__ AM/PM __:__ AM/PM       ___/____/____ __:__ AM/PM __:__ AM/PM       ___/____/____ __:__ AM/PM __:__ AM/PM       ___/____/____ __:__ AM/PM __:__ AM/PM       ___/____/____ __:__ AM/PM __:__ AM/PM       ___/____/____ __:__ AM/PM __:__ AM/PM       Nedra Hai, Division of Public  Health, Communicable Disease Branch

## 2018-09-26 ENCOUNTER — Other Ambulatory Visit: Payer: Self-pay | Admitting: *Deleted

## 2018-09-26 DIAGNOSIS — R6889 Other general symptoms and signs: Secondary | ICD-10-CM

## 2018-10-03 LAB — NOVEL CORONAVIRUS, NAA: SARS-COV-2, NAA: NOT DETECTED

## 2018-11-17 MED FILL — PHENTERMINE 37.5 MG TABLET: 37.5 | 30 days supply | Qty: 30 | Fill #0

## 2018-11-18 MED FILL — traZODone HCL 50 MG TABS: 50 | 30 days supply | Qty: 60 | Fill #0

## 2018-11-18 MED FILL — CITALOPRAM HBR 40 MG TABLET: 40 | 30 days supply | Qty: 30 | Fill #0

## 2018-12-25 MED FILL — traZODone HCL 50 MG TABS: 50 | 30 days supply | Qty: 60 | Fill #0

## 2018-12-25 MED FILL — CITALOPRAM HBR 40 MG TABLET: 40 | 30 days supply | Qty: 30 | Fill #0

## 2018-12-30 ENCOUNTER — Ambulatory Visit (INDEPENDENT_AMBULATORY_CARE_PROVIDER_SITE_OTHER): Payer: Self-pay | Admitting: Physician Assistant

## 2018-12-30 ENCOUNTER — Other Ambulatory Visit: Payer: Self-pay

## 2018-12-30 VITALS — BP 108/78 | HR 78 | Temp 97.8°F | Resp 16 | Ht 68.0 in | Wt 172.2 lb

## 2018-12-30 DIAGNOSIS — Z0000 Encounter for general adult medical examination without abnormal findings: Secondary | ICD-10-CM

## 2018-12-30 DIAGNOSIS — H547 Unspecified visual loss: Secondary | ICD-10-CM

## 2018-12-30 MED FILL — PHENTERMINE 37.5 MG TABLET: 37.5 | 30 days supply | Qty: 30 | Fill #0

## 2018-12-30 NOTE — Patient Instructions (Addendum)
Health Maintenance, Female   It was a pleasure meeting you today. Please make sure you follow up with an eye doctor as your vision is decreased in both eyes. You will also need to follow up with your primary care provider for health maintenance and lab work.   Adopting a healthy lifestyle and getting preventive care can go a long way to promote health and wellness. Talk with your health care provider about what schedule of regular examinations is right for you. This is a good chance for you to check in with your provider about disease prevention and staying healthy. In between checkups, there are plenty of things you can do on your own. Experts have done a lot of research about which lifestyle changes and preventive measures are most likely to keep you healthy. Ask your health care provider for more information. Weight and diet Eat a healthy diet  Be sure to include plenty of vegetables, fruits, low-fat dairy products, and lean protein.  Do not eat a lot of foods high in solid fats, added sugars, or salt.  Get regular exercise. This is one of the most important things you can do for your health. ? Most adults should exercise for at least 150 minutes each week. The exercise should increase your heart rate and make you sweat (moderate-intensity exercise). ? Most adults should also do strengthening exercises at least twice a week. This is in addition to the moderate-intensity exercise. Maintain a healthy weight  Body mass index (BMI) is a measurement that can be used to identify possible weight problems. It estimates body fat based on height and weight. Your health care provider can help determine your BMI and help you achieve or maintain a healthy weight.  For females 36 years of age and older: ? A BMI below 18.5 is considered underweight. ? A BMI of 18.5 to 24.9 is normal. ? A BMI of 25 to 29.9 is considered overweight. ? A BMI of 30 and above is considered obese. Watch levels of cholesterol  and blood lipids  You should start having your blood tested for lipids and cholesterol at 50 years of age, then have this test every 5 years.  You may need to have your cholesterol levels checked more often if: ? Your lipid or cholesterol levels are high. ? You are older than 50 years of age. ? You are at high risk for heart disease. Cancer screening Lung Cancer  Lung cancer screening is recommended for adults 57-45 years old who are at high risk for lung cancer because of a history of smoking.  A yearly low-dose CT scan of the lungs is recommended for people who: ? Currently smoke. ? Have quit within the past 15 years. ? Have at least a 30-pack-year history of smoking. A pack year is smoking an average of one pack of cigarettes a day for 1 year.  Yearly screening should continue until it has been 15 years since you quit.  Yearly screening should stop if you develop a health problem that would prevent you from having lung cancer treatment. Breast Cancer  Practice breast self-awareness. This means understanding how your breasts normally appear and feel.  It also means doing regular breast self-exams. Let your health care provider know about any changes, no matter how small.  If you are in your 20s or 30s, you should have a clinical breast exam (CBE) by a health care provider every 1-3 years as part of a regular health exam.  If you are  85 or older, have a CBE every year. Also consider having a breast X-ray (mammogram) every year.  If you have a family history of breast cancer, talk to your health care provider about genetic screening.  If you are at high risk for breast cancer, talk to your health care provider about having an MRI and a mammogram every year.  Breast cancer gene (BRCA) assessment is recommended for women who have family members with BRCA-related cancers. BRCA-related cancers include: ? Breast. ? Ovarian. ? Tubal. ? Peritoneal cancers.  Results of the assessment  will determine the need for genetic counseling and BRCA1 and BRCA2 testing. Cervical Cancer Your health care provider may recommend that you be screened regularly for cancer of the pelvic organs (ovaries, uterus, and vagina). This screening involves a pelvic examination, including checking for microscopic changes to the surface of your cervix (Pap test). You may be encouraged to have this screening done every 3 years, beginning at age 65.  For women ages 30-65, health care providers may recommend pelvic exams and Pap testing every 3 years, or they may recommend the Pap and pelvic exam, combined with testing for human papilloma virus (HPV), every 5 years. Some types of HPV increase your risk of cervical cancer. Testing for HPV may also be done on women of any age with unclear Pap test results.  Other health care providers may not recommend any screening for nonpregnant women who are considered low risk for pelvic cancer and who do not have symptoms. Ask your health care provider if a screening pelvic exam is right for you.  If you have had past treatment for cervical cancer or a condition that could lead to cancer, you need Pap tests and screening for cancer for at least 20 years after your treatment. If Pap tests have been discontinued, your risk factors (such as having a new sexual partner) need to be reassessed to determine if screening should resume. Some women have medical problems that increase the chance of getting cervical cancer. In these cases, your health care provider may recommend more frequent screening and Pap tests. Colorectal Cancer  This type of cancer can be detected and often prevented.  Routine colorectal cancer screening usually begins at 50 years of age and continues through 50 years of age.  Your health care provider may recommend screening at an earlier age if you have risk factors for colon cancer.  Your health care provider may also recommend using home test kits to check  for hidden blood in the stool.  A small camera at the end of a tube can be used to examine your colon directly (sigmoidoscopy or colonoscopy). This is done to check for the earliest forms of colorectal cancer.  Routine screening usually begins at age 37.  Direct examination of the colon should be repeated every 5-10 years through 50 years of age. However, you may need to be screened more often if early forms of precancerous polyps or small growths are found. Skin Cancer  Check your skin from head to toe regularly.  Tell your health care provider about any new moles or changes in moles, especially if there is a change in a mole's shape or color.  Also tell your health care provider if you have a mole that is larger than the size of a pencil eraser.  Always use sunscreen. Apply sunscreen liberally and repeatedly throughout the day.  Protect yourself by wearing long sleeves, pants, a wide-brimmed hat, and sunglasses whenever you are outside. Heart  disease, diabetes, and high blood pressure  High blood pressure causes heart disease and increases the risk of stroke. High blood pressure is more likely to develop in: ? People who have blood pressure in the high end of the normal range (130-139/85-89 mm Hg). ? People who are overweight or obese. ? People who are African American.  If you are 31-69 years of age, have your blood pressure checked every 3-5 years. If you are 41 years of age or older, have your blood pressure checked every year. You should have your blood pressure measured twice-once when you are at a hospital or clinic, and once when you are not at a hospital or clinic. Record the average of the two measurements. To check your blood pressure when you are not at a hospital or clinic, you can use: ? An automated blood pressure machine at a pharmacy. ? A home blood pressure monitor.  If you are between 66 years and 80 years old, ask your health care provider if you should take aspirin  to prevent strokes.  Have regular diabetes screenings. This involves taking a blood sample to check your fasting blood sugar level. ? If you are at a normal weight and have a low risk for diabetes, have this test once every three years after 50 years of age. ? If you are overweight and have a high risk for diabetes, consider being tested at a younger age or more often. Preventing infection Hepatitis B  If you have a higher risk for hepatitis B, you should be screened for this virus. You are considered at high risk for hepatitis B if: ? You were born in a country where hepatitis B is common. Ask your health care provider which countries are considered high risk. ? Your parents were born in a high-risk country, and you have not been immunized against hepatitis B (hepatitis B vaccine). ? You have HIV or AIDS. ? You use needles to inject street drugs. ? You live with someone who has hepatitis B. ? You have had sex with someone who has hepatitis B. ? You get hemodialysis treatment. ? You take certain medicines for conditions, including cancer, organ transplantation, and autoimmune conditions. Hepatitis C  Blood testing is recommended for: ? Everyone born from [birth year] through 1967. ? Anyone with known risk factors for hepatitis C. Sexually transmitted infections (STIs)  You should be screened for sexually transmitted infections (STIs) including gonorrhea and chlamydia if: ? You are sexually active and are younger than 50 years of age. ? You are older than 50 years of age and your health care provider tells you that you are at risk for this type of infection. ? Your sexual activity has changed since you were last screened and you are at an increased risk for chlamydia or gonorrhea. Ask your health care provider if you are at risk.  If you do not have HIV, but are at risk, it may be recommended that you take a prescription medicine daily to prevent HIV infection. This is called pre-exposure  prophylaxis (PrEP). You are considered at risk if: ? You are sexually active and do not regularly use condoms or know the HIV status of your partner(s). ? You take drugs by injection. ? You are sexually active with a partner who has HIV. Talk with your health care provider about whether you are at high risk of being infected with HIV. If you choose to begin PrEP, you should first be tested for HIV. You should then be  tested every 3 months for as long as you are taking PrEP. Pregnancy  If you are premenopausal and you may become pregnant, ask your health care provider about preconception counseling.  If you may become pregnant, take 400 to 800 micrograms (mcg) of folic acid every day.  If you want to prevent pregnancy, talk to your health care provider about birth control (contraception). Osteoporosis and menopause  Osteoporosis is a disease in which the bones lose minerals and strength with aging. This can result in serious bone fractures. Your risk for osteoporosis can be identified using a bone density scan.  If you are 65 years of age or older, or if you are at risk for osteoporosis and fractures, ask your health care provider if you should be screened.  Ask your health care provider whether you should take a calcium or vitamin D supplement to lower your risk for osteoporosis.  Menopause may have certain physical symptoms and risks.  Hormone replacement therapy may reduce some of these symptoms and risks. Talk to your health care provider about whether hormone replacement therapy is right for you. Follow these instructions at home:  Schedule regular health, dental, and eye exams.  Stay current with your immunizations.  Do not use any tobacco products including cigarettes, chewing tobacco, or electronic cigarettes.  If you are pregnant, do not drink alcohol.  If you are breastfeeding, limit how much and how often you drink alcohol.  Limit alcohol intake to no more than 1 drink  per day for nonpregnant women. One drink equals 12 ounces of beer, 5 ounces of wine, or 1 ounces of hard liquor.  Do not use street drugs.  Do not share needles.  Ask your health care provider for help if you need support or information about quitting drugs.  Tell your health care provider if you often feel depressed.  Tell your health care provider if you have ever been abused or do not feel safe at home. This information is not intended to replace advice given to you by your health care provider. Make sure you discuss any questions you have with your health care provider. Document Released: 01/08/2011 Document Revised: 12/01/2015 Document Reviewed: 03/29/2015 Elsevier Interactive Patient Education  2019 [employer].  Constipation, Adult Constipation is when a person has fewer bowel movements in a week than normal, has difficulty having a bowel movement, or has stools that are dry, hard, or larger than normal. Constipation may be caused by an underlying condition. It may become worse with age if a person takes certain medicines and does not take in enough fluids. Follow these instructions at home: Eating and drinking   Eat foods that have a lot of fiber, such as fresh fruits and vegetables, whole grains, and beans.  Limit foods that are high in fat, low in fiber, or overly processed, such as french fries, hamburgers, cookies, candies, and soda.  Drink enough fluid to keep your urine clear or pale yellow. General instructions  Exercise regularly or as told by your health care provider.  Go to the restroom when you have the urge to go. Do not hold it in.  Take over-the-counter and prescription medicines only as told by your health care provider. These include any fiber supplements.  Practice pelvic floor retraining exercises, such as deep breathing while relaxing the lower abdomen and pelvic floor relaxation during bowel movements.  Watch your condition for any changes.  Keep  all follow-up visits as told by your health care provider. This is  important. Contact a health care provider if:  You have pain that gets worse.  You have a fever.  You do not have a bowel movement after 4 days.  You vomit.  You are not hungry.  You lose weight.  You are bleeding from the anus.  You have thin, pencil-like stools. Get help right away if:  You have a fever and your symptoms suddenly get worse.  You leak stool or have blood in your stool.  Your abdomen is bloated.  You have severe pain in your abdomen.  You feel dizzy or you faint. This information is not intended to replace advice given to you by your health care provider. Make sure you discuss any questions you have with your health care provider. Document Released: 03/23/2004 Document Revised: 01/13/2016 Document Reviewed: 12/14/2015 Elsevier Interactive Patient Education  2019 [employer].

## 2018-12-30 NOTE — Progress Notes (Signed)
Robin Huffman  MRN: [id number] DOB: 1971-05-14  Subjective:  Pt is a 48 y.o. female who presents for physical exam for work. She is employed by [employer] and needs physical before July 1st. Has a PCP, Dr. Vernice Jefferson, but could not get in with her until July 21st. She typically sees her PCP every 6 months. Has PMH of HTN, depression, and hypothyroidism. Takes medications as prescribed.   Eats mostly meat and veggies. Does not drink much water at all. No multivitamins. No structured exercise, does walk the dog.  Has BM every 3-4 days-> does not eat much fiber.  Has not had menstrual cycle in ~1 year.   Dental exam every 6 months. Brushes BID.  Has not had a vision exam in a couple of years. Does not currently wear eyeglasses or contact lens. Has noticed difficulty seeing things in the distance for the past few years.    Last pap smear: "a few years ago"  Last mammogram: one year ago Last colonoscopy: never  UTD on vaccinations.   Denies tobacco and illicit drug use. Drinks alcohol occasionally.    There are no active problems to display for this patient.   Current Outpatient Medications on File Prior to Visit  Medication Sig Dispense Refill  . hydrochlorothiazide (HYDRODIURIL) 25 MG tablet Take 25 mg by mouth daily.  3  . levothyroxine (SYNTHROID, LEVOTHROID) 150 MCG tablet Take 150 mcg by mouth daily before breakfast.    . NP THYROID 30 MG tablet     . phentermine (ADIPEX-P) 37.5 MG tablet     . promethazine-dextromethorphan (PROMETHAZINE-DM) 6.25-15 MG/5ML syrup Take 5 mLs by mouth 4 (four) times daily as needed for cough. 118 mL 0  . thyroid (NP THYROID) 120 MG tablet     . traZODone (DESYREL) 50 MG tablet Take 100 mg by mouth at bedtime.  0  . valACYclovir (VALTREX) 1000 MG tablet     . aspirin-acetaminophen-caffeine (EXCEDRIN MIGRAINE) 250-250-65 MG tablet Take 2 tablets by mouth every 6 (six) hours as needed for headache.    . citalopram (CELEXA) 40 MG tablet Take 1 tablet by mouth  at bedtime.     No current facility-administered medications on file prior to visit.     No Known Allergies    Past Surgical History:  Procedure Laterality Date  . BTL    . CERVICAL FUSION    . TUBAL LIGATION      No family history on file.  Review of Systems  Constitutional: Negative for chills, diaphoresis and fever.  HENT: Negative for congestion, sinus pain and sore throat.   Respiratory: Negative for shortness of breath.   Cardiovascular: Negative for chest pain and palpitations.  Gastrointestinal: Negative for abdominal pain, nausea and vomiting.  Neurological: Negative for dizziness.    Objective:  BP 108/78 (BP Location: Right Arm, Patient Position: Sitting, Cuff Size: Normal)   Pulse 78   Temp 97.8 F (36.6 C) (Oral)   Resp 16   Ht 5\' 8"  (1.727 m)   Wt 172 lb 3.2 oz (78.1 kg)   SpO2 98%   BMI 26.18 kg/m   Physical Exam Constitutional:      General: She is not in acute distress.    Appearance: Normal appearance. She is not ill-appearing or toxic-appearing.  HENT:     Head: Normocephalic and atraumatic.     Right Ear: Hearing, tympanic membrane, ear canal and external ear normal.     Left Ear: Hearing, tympanic membrane, ear canal and  external ear normal.     Nose: Nose normal.     Mouth/Throat:     Pharynx: Uvula midline. No oropharyngeal exudate.  Eyes:     General: Lids are normal. No scleral icterus.    Conjunctiva/sclera: Conjunctivae normal.     Pupils: Pupils are equal, round, and reactive to light.     Funduscopic exam:    Right eye: No AV nicking or papilledema. Red reflex present.        Left eye: No AV nicking or papilledema. Red reflex present.    Visual Fields: Right eye visual fields normal and left eye visual fields normal.  Neck:     Musculoskeletal: Normal range of motion.     Trachea: Trachea normal.  Cardiovascular:     Rate and Rhythm: Normal rate and regular rhythm.     Heart sounds: Normal heart sounds.  Pulmonary:      Effort: Pulmonary effort is normal.     Breath sounds: Normal breath sounds.  Abdominal:     Palpations: Abdomen is soft.     Tenderness: There is no abdominal tenderness.  Lymphadenopathy:     Head:     Right side of head: No tonsillar, preauricular, posterior auricular or occipital adenopathy.     Left side of head: No tonsillar, preauricular, posterior auricular or occipital adenopathy.     Cervical: No cervical adenopathy.     Upper Body:     Right upper body: No supraclavicular adenopathy.     Left upper body: No supraclavicular adenopathy.  Skin:    General: Skin is warm and dry.  Neurological:     Mental Status: She is alert and oriented to person, place, and time.     Deep Tendon Reflexes: Reflexes are normal and symmetric.     Hearing Screening   125Hz  250Hz  500Hz  1000Hz  2000Hz  3000Hz  4000Hz  6000Hz  8000Hz   Right ear:           Left ear:             Visual Acuity Screening   Right eye Left eye Both eyes  Without correction: 20/100 20/100 20/100  With correction:       Assessment and Plan :  1. Physical exam No acute findings on PE. Normal VSS. Decreased VA-see below. Plan to f/u with PCP as planned.  2. Decreased visual acuity Pt has appointment with eye doctor scheduled for July. Recommend she contact them and see if she could schedule for earlier appointment. If not, f/u as planned.    Tenna Delaine, Hershal Coria  [facility] 12/30/2018 12:19 PM

## 2019-01-22 DIAGNOSIS — Z79899 Other long term (current) drug therapy: Secondary | ICD-10-CM | POA: Diagnosis not present

## 2019-01-22 DIAGNOSIS — Z5181 Encounter for therapeutic drug level monitoring: Secondary | ICD-10-CM | POA: Diagnosis not present

## 2019-01-23 MED FILL — NP THYROID 120 MG TABLET: 120 | 90 days supply | Qty: 90 | Fill #0

## 2019-01-23 MED FILL — traZODone HCL 50 MG TABS: 50 | 30 days supply | Qty: 60 | Fill #0

## 2019-01-23 MED FILL — NP THYROID 30 MG TABLET: 30 | 90 days supply | Qty: 90 | Fill #0

## 2019-01-23 MED FILL — HYDROCHLOROTHIAZIDE 25 MG T: 25 | 90 days supply | Qty: 90 | Fill #0

## 2019-02-16 DIAGNOSIS — Z6827 Body mass index (BMI) 27.0-27.9, adult: Secondary | ICD-10-CM | POA: Diagnosis not present

## 2019-02-16 DIAGNOSIS — Z1389 Encounter for screening for other disorder: Secondary | ICD-10-CM | POA: Diagnosis not present

## 2019-02-16 DIAGNOSIS — Z1331 Encounter for screening for depression: Secondary | ICD-10-CM | POA: Diagnosis not present

## 2019-02-16 DIAGNOSIS — F419 Anxiety disorder, unspecified: Secondary | ICD-10-CM | POA: Diagnosis not present

## 2019-02-16 MED FILL — CITALOPRAM HBR 40 MG TABLET: 40 | 90 days supply | Qty: 90 | Fill #0

## 2019-02-16 MED FILL — clonazePAM 0.5 MG TABS: 0.5 | 30 days supply | Qty: 30 | Fill #0

## 2019-03-03 IMAGING — MG DIGITAL SCREENING BILATERAL MAMMOGRAM WITH TOMO AND CAD
8 series · 9 of 24 positions shown · non-contrast
Comparison: Previous exam(s).

CLINICAL DATA: Screening.

EXAM:
DIGITAL SCREENING BILATERAL MAMMOGRAM WITH TOMO AND CAD

[R CC synth-2D]
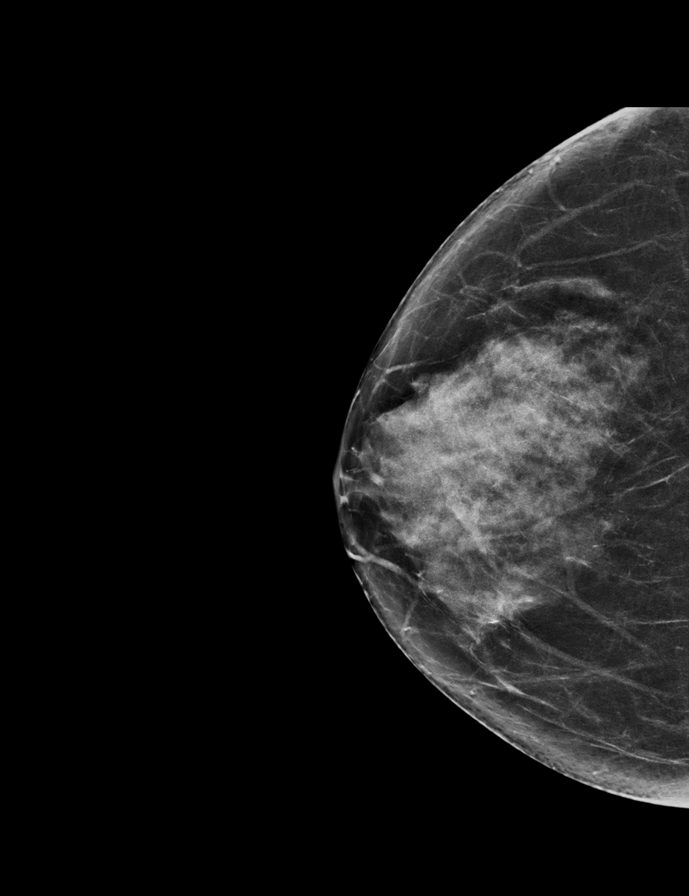

[R MLO synth-2D]
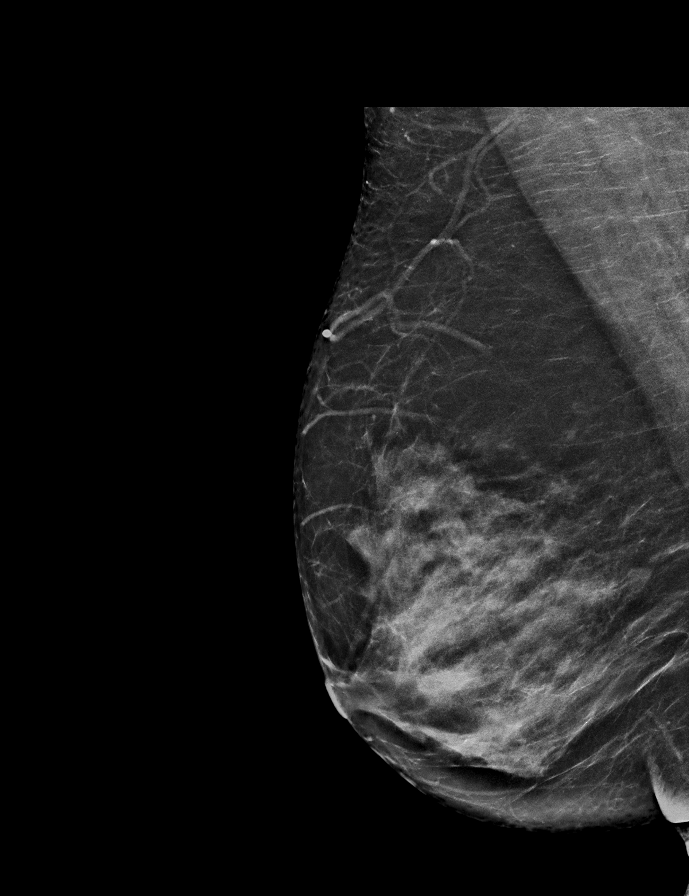

[L CC synth-2D]
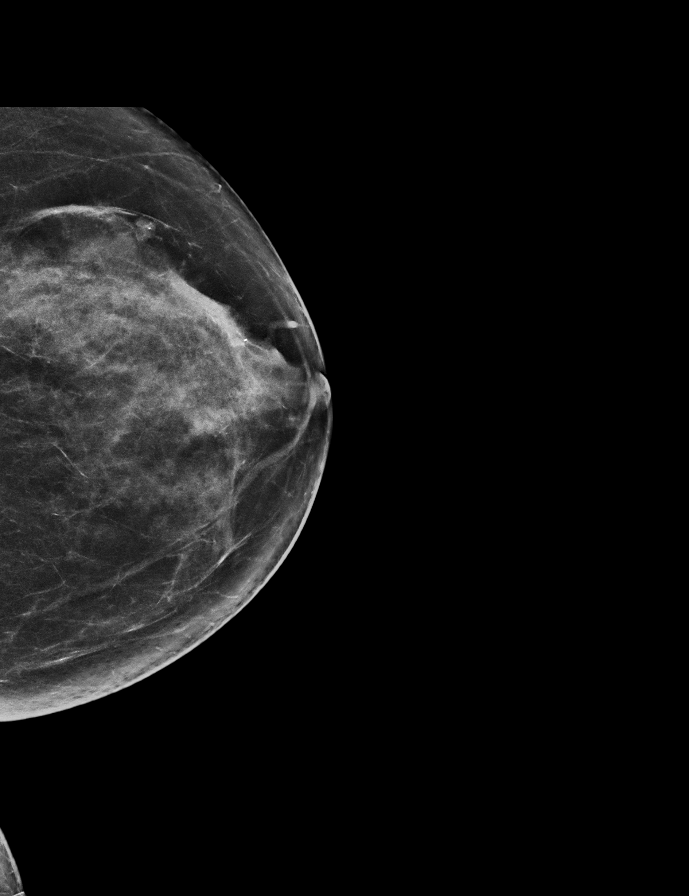

[L MLO synth-2D]
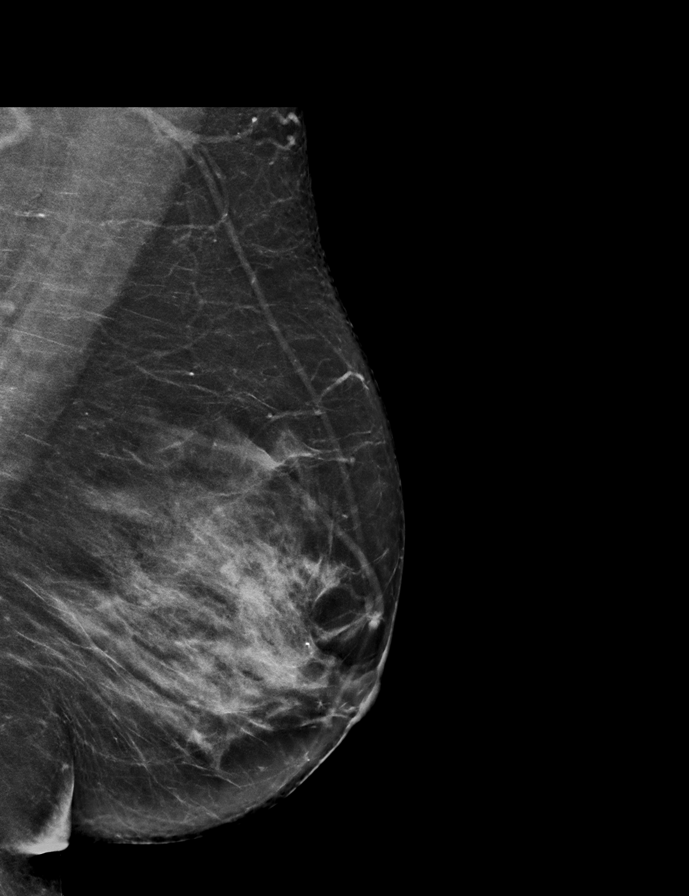

[R CC tomo · 2 of 66 frames shown]
[frame 22/66]
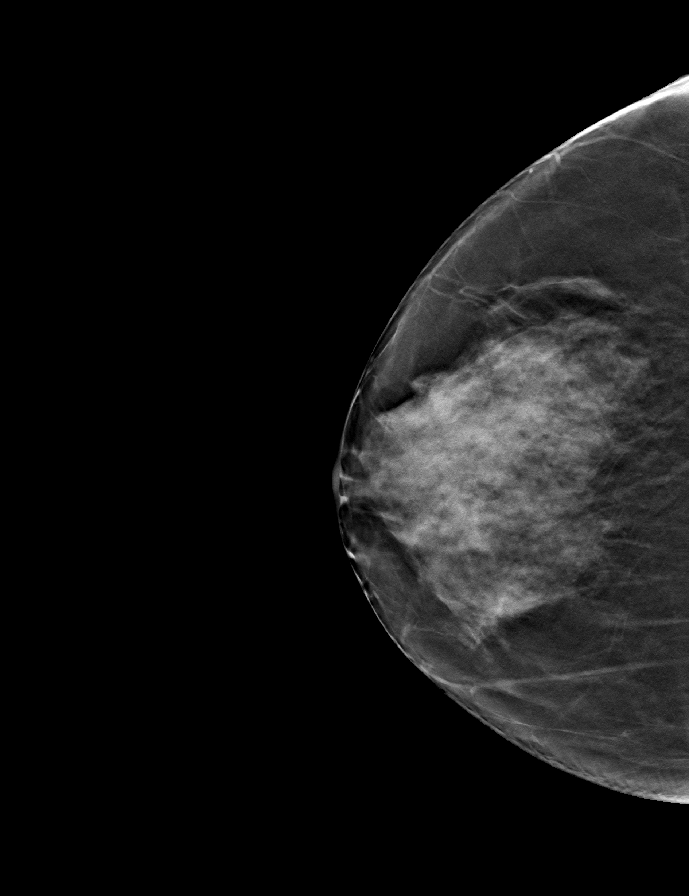
[frame 33/66]
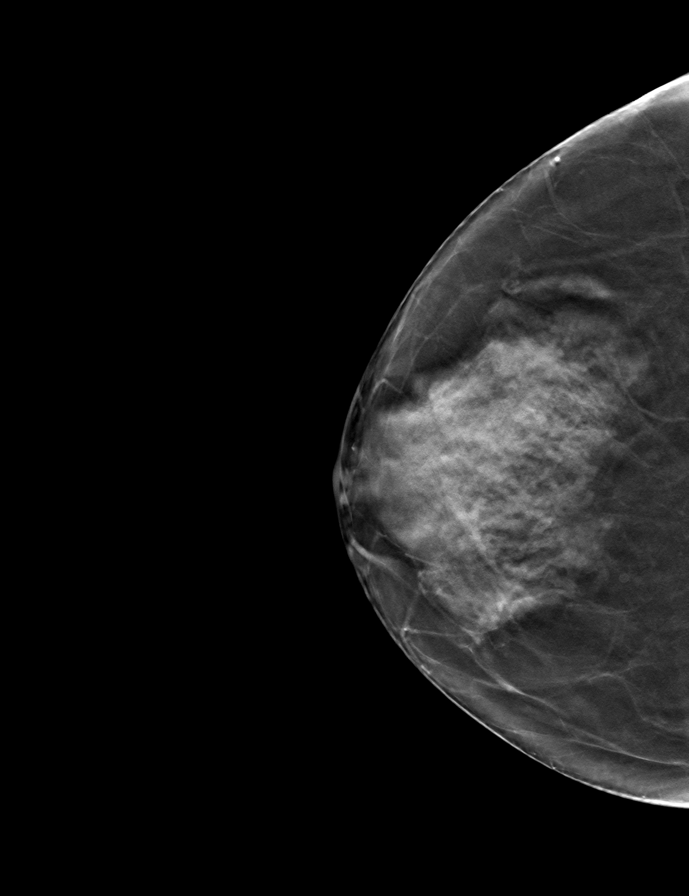

[L MLO tomo · tomo slice 36/71.0]
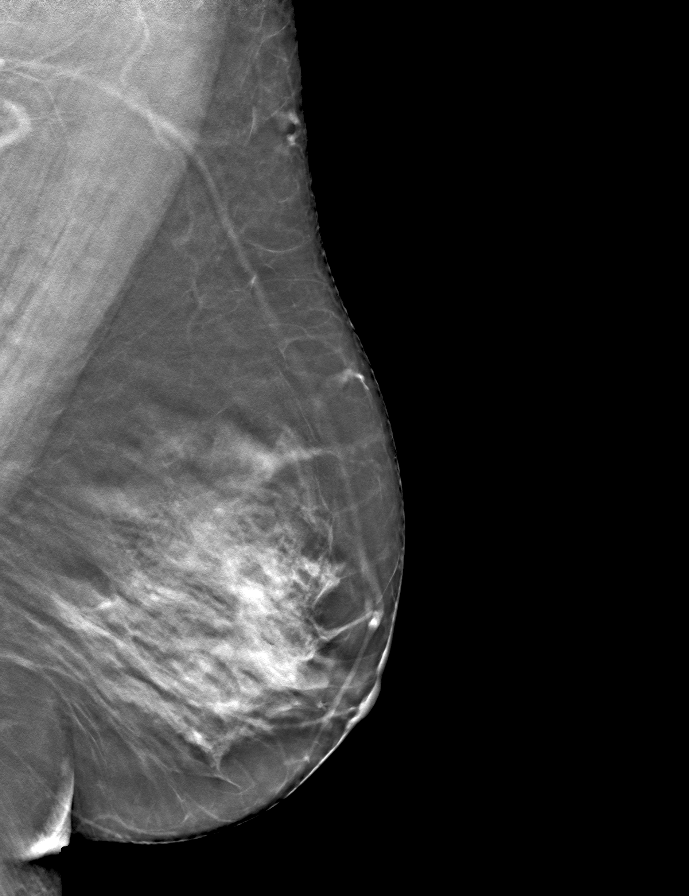

[L CC tomo · tomo slice 33/64.0]
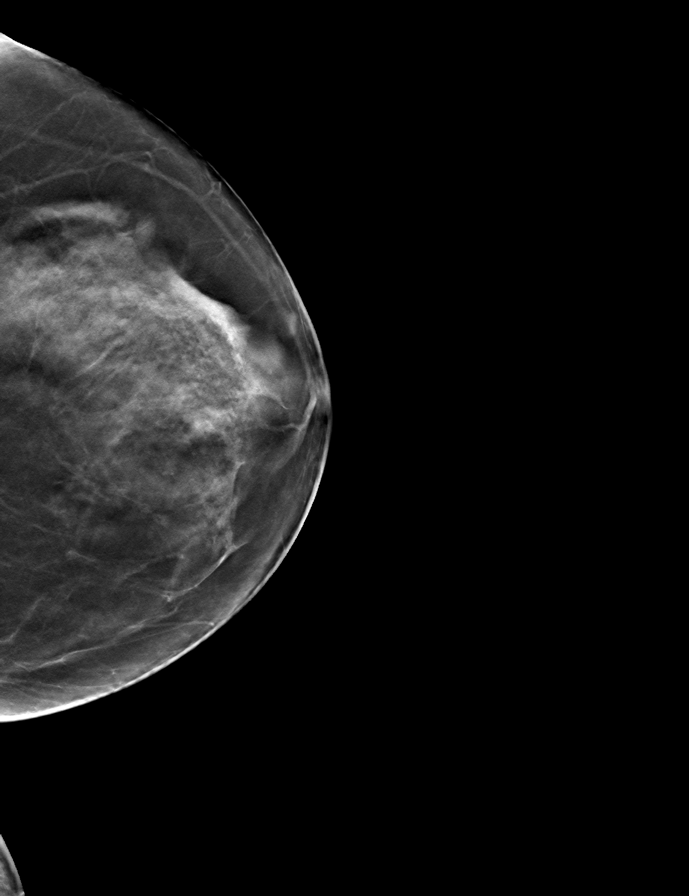

[R MLO tomo · tomo slice 35/68.0]
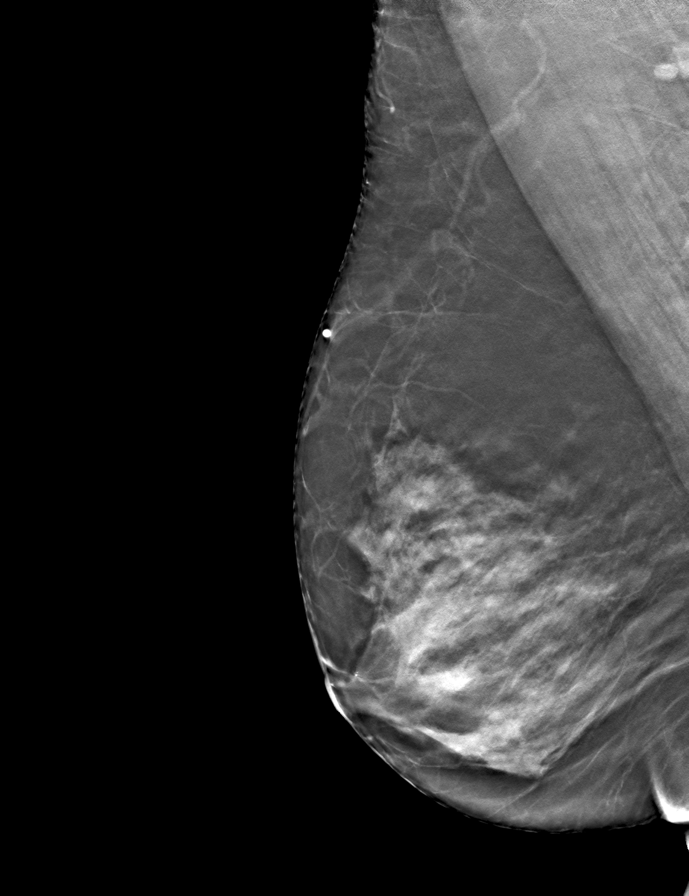

[9 of 24 positions shown; findings below may reference images not displayed]

ACR Breast Density Category c: The breast tissue is heterogeneously
dense, which may obscure small masses.
FINDINGS: There are no findings suspicious for malignancy. Images were
processed with CAD.
IMPRESSION: No mammographic evidence of malignancy. A result letter of this
screening mammogram will be mailed directly to the patient.

RECOMMENDATION:
Screening mammogram in one year. (Code:[id number]-U-LHB)

BI-RADS CATEGORY  1: Negative.

## 2019-03-17 MED FILL — traZODone HCL 50 MG TABS: 50 | 30 days supply | Qty: 60 | Fill #0

## 2019-03-18 MED FILL — PHENTERMINE 37.5 MG TABLET: 37.5 | 30 days supply | Qty: 30 | Fill #0

## 2019-04-13 DIAGNOSIS — E039 Hypothyroidism, unspecified: Secondary | ICD-10-CM | POA: Diagnosis not present

## 2019-04-13 DIAGNOSIS — N959 Unspecified menopausal and perimenopausal disorder: Secondary | ICD-10-CM | POA: Diagnosis not present

## 2019-04-13 DIAGNOSIS — E663 Overweight: Secondary | ICD-10-CM | POA: Diagnosis not present

## 2019-04-13 MED FILL — PHENTERMINE 37.5 MG TABLET: 37.5 | 30 days supply | Qty: 30 | Fill #0

## 2019-06-15 MED FILL — CITALOPRAM HBR 40 MG TABLET: 40 | 30 days supply | Qty: 30 | Fill #0

## 2019-06-15 MED FILL — PHENTERMINE 37.5 MG TABLET: 37.5 | 30 days supply | Qty: 30 | Fill #1

## 2019-06-15 MED FILL — HYDROCHLOROTHIAZIDE 25 MG T: 25 | 90 days supply | Qty: 90 | Fill #1

## 2019-06-15 MED FILL — NP THYROID 120 MG TABLET: 120 | 90 days supply | Qty: 90 | Fill #1

## 2019-06-15 MED FILL — traZODone HCL 50 MG TABS: 50 | 30 days supply | Qty: 60 | Fill #0

## 2019-06-15 MED FILL — NP THYROID 30 MG TABLET: 30 | 90 days supply | Qty: 90 | Fill #1

## 2019-06-30 DIAGNOSIS — U071 COVID-19: Secondary | ICD-10-CM | POA: Diagnosis not present

## 2019-06-30 MED FILL — ONDANSETRON ODT 8 MG TABLET: 8 | 3 days supply | Qty: 12 | Fill #0

## 2019-07-29 MED FILL — traZODone HCL 50 MG TABS: 50 | 30 days supply | Qty: 60 | Fill #0

## 2019-07-29 MED FILL — CITALOPRAM HBR 40 MG TABLET: 40 | 30 days supply | Qty: 30 | Fill #0

## 2019-08-10 DIAGNOSIS — E039 Hypothyroidism, unspecified: Secondary | ICD-10-CM | POA: Diagnosis not present

## 2019-08-10 DIAGNOSIS — Z6831 Body mass index (BMI) 31.0-31.9, adult: Secondary | ICD-10-CM | POA: Diagnosis not present

## 2019-08-10 DIAGNOSIS — F419 Anxiety disorder, unspecified: Secondary | ICD-10-CM | POA: Diagnosis not present

## 2019-08-10 MED FILL — clonazePAM 0.5 MG TABS: 0.5 | 30 days supply | Qty: 30 | Fill #0

## 2019-08-11 DIAGNOSIS — Z79899 Other long term (current) drug therapy: Secondary | ICD-10-CM | POA: Diagnosis not present

## 2019-08-11 DIAGNOSIS — Z5181 Encounter for therapeutic drug level monitoring: Secondary | ICD-10-CM | POA: Diagnosis not present

## 2019-09-04 MED FILL — PHENTERMINE 37.5 MG TABLET: 37.5 | 30 days supply | Qty: 30 | Fill #2

## 2019-09-04 MED FILL — CITALOPRAM HBR 40 MG TABLET: 40 | 90 days supply | Qty: 90 | Fill #0

## 2019-09-04 MED FILL — traZODone HCL 50 MG TABS: 50 | 90 days supply | Qty: 180 | Fill #0

## 2019-10-05 DIAGNOSIS — R1013 Epigastric pain: Secondary | ICD-10-CM | POA: Diagnosis not present

## 2019-10-05 DIAGNOSIS — E039 Hypothyroidism, unspecified: Secondary | ICD-10-CM | POA: Diagnosis not present

## 2019-10-05 DIAGNOSIS — Z1211 Encounter for screening for malignant neoplasm of colon: Secondary | ICD-10-CM | POA: Diagnosis not present

## 2019-10-05 DIAGNOSIS — F331 Major depressive disorder, recurrent, moderate: Secondary | ICD-10-CM | POA: Diagnosis not present

## 2019-10-05 DIAGNOSIS — I10 Essential (primary) hypertension: Secondary | ICD-10-CM | POA: Diagnosis not present

## 2019-10-05 MED FILL — LISINOPRIL-HCTZ 20-25 MG TA: 20-25 | 90 days supply | Qty: 90 | Fill #0

## 2019-10-19 DIAGNOSIS — R1011 Right upper quadrant pain: Secondary | ICD-10-CM | POA: Diagnosis not present

## 2019-10-19 DIAGNOSIS — R1013 Epigastric pain: Secondary | ICD-10-CM | POA: Diagnosis not present

## 2019-10-19 DIAGNOSIS — R1084 Generalized abdominal pain: Secondary | ICD-10-CM | POA: Diagnosis not present

## 2019-10-19 DIAGNOSIS — R7989 Other specified abnormal findings of blood chemistry: Secondary | ICD-10-CM | POA: Diagnosis not present

## 2019-10-19 MED FILL — PANTOPRAZOLE SOD DR 40 MG T: 40 | 30 days supply | Qty: 30 | Fill #0

## 2019-10-28 DIAGNOSIS — K219 Gastro-esophageal reflux disease without esophagitis: Secondary | ICD-10-CM | POA: Diagnosis not present

## 2019-10-28 DIAGNOSIS — K760 Fatty (change of) liver, not elsewhere classified: Secondary | ICD-10-CM | POA: Diagnosis not present

## 2019-10-28 DIAGNOSIS — R1011 Right upper quadrant pain: Secondary | ICD-10-CM | POA: Diagnosis not present

## 2019-10-30 MED FILL — NP THYROID 30 MG TABLET: 30 | 30 days supply | Qty: 30 | Fill #0

## 2019-11-02 DIAGNOSIS — K263 Acute duodenal ulcer without hemorrhage or perforation: Secondary | ICD-10-CM | POA: Diagnosis not present

## 2019-11-02 DIAGNOSIS — R1013 Epigastric pain: Secondary | ICD-10-CM | POA: Diagnosis not present

## 2019-11-02 DIAGNOSIS — R6881 Early satiety: Secondary | ICD-10-CM | POA: Diagnosis not present

## 2019-11-02 DIAGNOSIS — Z1211 Encounter for screening for malignant neoplasm of colon: Secondary | ICD-10-CM | POA: Diagnosis not present

## 2019-11-02 DIAGNOSIS — K3189 Other diseases of stomach and duodenum: Secondary | ICD-10-CM | POA: Diagnosis not present

## 2019-11-06 MED FILL — NP THYROID 120 MG TABLET: 120 | 90 days supply | Qty: 90 | Fill #0

## 2019-11-12 DIAGNOSIS — E039 Hypothyroidism, unspecified: Secondary | ICD-10-CM | POA: Diagnosis not present

## 2019-11-12 DIAGNOSIS — E559 Vitamin D deficiency, unspecified: Secondary | ICD-10-CM | POA: Diagnosis not present

## 2019-12-09 MED FILL — CITALOPRAM HBR 40 MG TABLET: 40 | 30 days supply | Qty: 30 | Fill #0

## 2019-12-09 MED FILL — clonazePAM 0.5 MG TABS: 0.5 | 30 days supply | Qty: 30 | Fill #0

## 2019-12-09 MED FILL — traZODone HCL 50 MG TABS: 50 | 30 days supply | Qty: 30 | Fill #0

## 2020-01-07 DEATH — deceased
